# Patient Record
Sex: Female | Born: 1945 | Race: Black or African American | Hispanic: No | Marital: Single | Smoking: Never smoker
Health system: Southern US, Community
[De-identification: ages and names within clinical notes are randomized; demographics above are authoritative.]

## PROBLEM LIST (undated history)

## (undated) DIAGNOSIS — M810 Age-related osteoporosis without current pathological fracture: Secondary | ICD-10-CM

## (undated) DIAGNOSIS — R7303 Prediabetes: Secondary | ICD-10-CM

## (undated) DIAGNOSIS — E559 Vitamin D deficiency, unspecified: Secondary | ICD-10-CM

## (undated) HISTORY — PX: ABDOMINAL HYSTERECTOMY: SHX81

## (undated) HISTORY — PX: OTHER SURGICAL HISTORY: SHX169

## (undated) HISTORY — PX: COLONOSCOPY: SHX174

---

## 2003-11-18 ENCOUNTER — Ambulatory Visit (HOSPITAL_COMMUNITY): Admission: RE | Admit: 2003-11-18 | Discharge: 2003-11-18 | Payer: Self-pay | Admitting: Family Medicine

## 2006-09-10 ENCOUNTER — Ambulatory Visit: Payer: Self-pay | Admitting: Orthopedic Surgery

## 2011-05-16 ENCOUNTER — Other Ambulatory Visit: Payer: Self-pay | Admitting: Family Medicine

## 2011-05-16 ENCOUNTER — Ambulatory Visit (HOSPITAL_COMMUNITY)
Admission: RE | Admit: 2011-05-16 | Discharge: 2011-05-16 | Disposition: A | Discharge status: Home / Self Care | Payer: Managed Care, Other (non HMO) | Source: Ambulatory Visit | Attending: Family Medicine | Admitting: Family Medicine

## 2011-05-16 DIAGNOSIS — R079 Chest pain, unspecified: Secondary | ICD-10-CM | POA: Insufficient documentation

## 2011-06-06 ENCOUNTER — Ambulatory Visit (HOSPITAL_COMMUNITY)
Admission: RE | Admit: 2011-06-06 | Discharge: 2011-06-06 | Disposition: A | Discharge status: Home / Self Care | Payer: Managed Care, Other (non HMO) | Source: Ambulatory Visit | Attending: Family Medicine | Admitting: Family Medicine

## 2011-06-06 ENCOUNTER — Other Ambulatory Visit: Payer: Self-pay | Admitting: Family Medicine

## 2011-06-06 DIAGNOSIS — R9389 Abnormal findings on diagnostic imaging of other specified body structures: Secondary | ICD-10-CM

## 2011-06-06 DIAGNOSIS — J40 Bronchitis, not specified as acute or chronic: Secondary | ICD-10-CM | POA: Insufficient documentation

## 2011-06-06 DIAGNOSIS — R05 Cough: Secondary | ICD-10-CM | POA: Insufficient documentation

## 2011-06-06 DIAGNOSIS — R059 Cough, unspecified: Secondary | ICD-10-CM | POA: Insufficient documentation

## 2011-06-06 DIAGNOSIS — R918 Other nonspecific abnormal finding of lung field: Secondary | ICD-10-CM | POA: Insufficient documentation

## 2021-10-22 ENCOUNTER — Encounter (HOSPITAL_COMMUNITY): Payer: Self-pay

## 2021-10-22 ENCOUNTER — Other Ambulatory Visit: Payer: Self-pay

## 2021-10-22 ENCOUNTER — Other Ambulatory Visit: Payer: Self-pay | Admitting: Preventative Medicine

## 2021-10-22 ENCOUNTER — Emergency Department (HOSPITAL_COMMUNITY)
Admission: EM | Admit: 2021-10-22 | Discharge: 2021-10-22 | Disposition: A | Discharge status: Home / Self Care | Payer: Self-pay | Attending: Emergency Medicine | Admitting: Emergency Medicine

## 2021-10-22 ENCOUNTER — Emergency Department (HOSPITAL_COMMUNITY): Payer: Self-pay

## 2021-10-22 DIAGNOSIS — K8020 Calculus of gallbladder without cholecystitis without obstruction: Secondary | ICD-10-CM | POA: Insufficient documentation

## 2021-10-22 DIAGNOSIS — N823 Fistula of vagina to large intestine: Secondary | ICD-10-CM | POA: Insufficient documentation

## 2021-10-22 DIAGNOSIS — R1031 Right lower quadrant pain: Secondary | ICD-10-CM

## 2021-10-22 DIAGNOSIS — K5730 Diverticulosis of large intestine without perforation or abscess without bleeding: Secondary | ICD-10-CM | POA: Insufficient documentation

## 2021-10-22 DIAGNOSIS — N3001 Acute cystitis with hematuria: Secondary | ICD-10-CM | POA: Insufficient documentation

## 2021-10-22 DIAGNOSIS — D72829 Elevated white blood cell count, unspecified: Secondary | ICD-10-CM | POA: Insufficient documentation

## 2021-10-22 LAB — CBC
HCT: 39 % (ref 36.0–46.0)
Hemoglobin: 13.9 g/dL (ref 12.0–15.0)
MCH: 30.4 pg (ref 26.0–34.0)
MCHC: 35.6 g/dL (ref 30.0–36.0)
MCV: 85.3 fL (ref 80.0–100.0)
Platelets: 206 10*3/uL (ref 150–400)
RBC: 4.57 MIL/uL (ref 3.87–5.11)
RDW: 12.9 % (ref 11.5–15.5)
WBC: 11.7 10*3/uL — ABNORMAL HIGH (ref 4.0–10.5)
nRBC: 0 % (ref 0.0–0.2)

## 2021-10-22 LAB — COMPREHENSIVE METABOLIC PANEL
ALT: 13 U/L (ref 0–44)
AST: 18 U/L (ref 15–41)
Albumin: 3.4 g/dL — ABNORMAL LOW (ref 3.5–5.0)
Alkaline Phosphatase: 83 U/L (ref 38–126)
Anion gap: 7 (ref 5–15)
BUN: 10 mg/dL (ref 8–23)
CO2: 26 mmol/L (ref 22–32)
Calcium: 8.8 mg/dL — ABNORMAL LOW (ref 8.9–10.3)
Chloride: 101 mmol/L (ref 98–111)
Creatinine, Ser: 0.75 mg/dL (ref 0.44–1.00)
GFR, Estimated: >60 mL/min (ref >60)
Glucose, Bld: 118 mg/dL — ABNORMAL HIGH (ref 70–99)
Potassium: 3.6 mmol/L (ref 3.5–5.1)
Sodium: 134 mmol/L — ABNORMAL LOW (ref 135–145)
Total Bilirubin: 1.3 mg/dL — ABNORMAL HIGH (ref 0.3–1.2)
Total Protein: 8.2 g/dL — ABNORMAL HIGH (ref 6.5–8.1)

## 2021-10-22 LAB — URINALYSIS, ROUTINE W REFLEX MICROSCOPIC
Bilirubin Urine: NEGATIVE
Glucose, UA: NEGATIVE mg/dL
Ketones, ur: 5 mg/dL — AB
Nitrite: POSITIVE — AB
Protein, ur: 30 mg/dL — AB
Specific Gravity, Urine: 1.017 (ref 1.005–1.030)
WBC, UA: >50 WBC/hpf — ABNORMAL HIGH (ref 0–5)
pH: 5 (ref 5.0–8.0)

## 2021-10-22 LAB — LIPASE, BLOOD: Lipase: 24 U/L (ref 11–51)

## 2021-10-22 MED ORDER — CEPHALEXIN 500 MG PO CAPS: 500.0000 mg | ORAL_CAPSULE | Freq: Two times a day (BID) | ORAL | 0 refills | Status: DC

## 2021-10-22 MED ORDER — IOHEXOL 300 MG/ML  SOLN
100.0000 mL | Freq: Once | INTRAMUSCULAR | Status: AC | PRN
  Administered 2021-10-22: 100 mL via INTRAVENOUS

## 2021-10-22 MED ORDER — CEPHALEXIN 500 MG PO CAPS
500.0000 mg | ORAL_CAPSULE | Freq: Once | ORAL | Status: AC
  Administered 2021-10-22: 500 mg via ORAL
  Filled 2021-10-22: qty 1

## 2021-10-22 NOTE — ED Notes (Signed)
Pt transported to CT ?

## 2021-10-22 NOTE — ED Notes (Signed)
Patient verbalizes understanding of discharge instructions. Opportunity for questioning and answers were provided. Armband removed by staff, pt discharged from ED to home via POV, ambulatory at DC

## 2021-10-22 NOTE — ED Triage Notes (Signed)
Patient complaining of abdominal pain that started two days ago. No N/V/D. Pain to RLQ. States that she was sent from [facility] for further eval.

## 2021-10-22 NOTE — Discharge Instructions (Addendum)
Ensure that you complete the entire course of antibiotic.  You may take cranberry as needed.  Return to the emergency department if you are experiencing increasing or worsening abdominal pain, urinary symptoms, or decreased fluid intake.

## 2021-10-22 NOTE — ED Provider Notes (Signed)
[facility] EMERGENCY DEPARTMENT Provider Note   CSN: [id number] Arrival date & time: 10/22/21  1343     History Chief Complaint  Patient presents with   Abdominal Pain    Kylie Casey is a 75 y.o. female with no pertinent past medical history presented to the emergency department complaining of intermittent, nonradiating, right lower quadrant pain onset 3 days ago.  Patient reports that over the weekend her pain was 10/10, however now her pain is mild.  She was seen at urgent care today and informed to come to the ED for further evaluation. She has tried a heat pack with minimal relief of her symptoms.  Patient denies any alleviating or exacerbating factors.  She denies nausea, vomiting, diarrhea, fever, chills, constipation, chest pain, or shortness of breath. Denies allergies to medications.  Patient denies alcohol use, new foods, sick contacts.   The history is provided by the patient and a relative. No language interpreter was used.  Abdominal Pain Pain location:  RLQ Pain radiates to:  Does not radiate Onset quality:  Gradual Duration:  3 days Timing:  Intermittent Progression:  Unchanged Chronicity:  New Context: not diet changes, not recent illness, not recent travel and not trauma   Relieved by:  None tried Worsened by:  Nothing Ineffective treatments:  None tried Associated symptoms: no chest pain, no chills, no constipation, no diarrhea, no dysuria, no fever, no hematuria, no nausea, no shortness of breath and no vomiting       History reviewed. No pertinent past medical history.  There are no problems to display for this patient.   Past Surgical History:  Procedure Laterality Date   ABDOMINAL HYSTERECTOMY       OB History   No obstetric history on file.     History reviewed. No pertinent family history.  Social History   Tobacco Use   Smoking status: Never   Smokeless tobacco: Never  Substance Use Topics   Alcohol use: Never   Drug use: Never     Home Medications Prior to Admission medications   Medication Sig Start Date End Date Taking? Authorizing Provider  cephALEXin (KEFLEX) 500 MG capsule Take 1 capsule (500 mg total) by mouth 2 (two) times daily. 10/22/21  Yes Maryellen Dowdle A, PA    Allergies    Patient has no allergy information on record.  Review of Systems   Review of Systems  Constitutional:  Negative for chills and fever.  Respiratory:  Negative for shortness of breath.   Cardiovascular:  Negative for chest pain.  Gastrointestinal:  Positive for abdominal pain. Negative for constipation, diarrhea, nausea and vomiting.  Genitourinary:  Negative for dysuria, frequency and hematuria.  Skin:  Negative for rash.  Allergic/Immunologic: Negative for immunocompromised state.  Neurological:  Negative for weakness and numbness.   Physical Exam Updated Vital Signs BP 124/83 (BP Location: Right Arm)   Pulse 89   Temp 98.6 F (37 C) (Oral)   Resp 18   Ht 5\' 1"  (1.549 m)   Wt 68 kg   SpO2 96%   BMI 28.34 kg/m   Physical Exam Vitals and nursing note reviewed.  Constitutional:      General: She is not in acute distress.    Appearance: She is not diaphoretic.  HENT:     Head: Normocephalic and atraumatic.     Mouth/Throat:     Pharynx: No oropharyngeal exudate.  Eyes:     General: No scleral icterus.    Conjunctiva/sclera: Conjunctivae normal.  Cardiovascular:     Rate and Rhythm: Normal rate and regular rhythm.     Pulses: Normal pulses.     Heart sounds: Normal heart sounds.  Pulmonary:     Effort: Pulmonary effort is normal. No respiratory distress.     Breath sounds: Normal breath sounds. No wheezing.  Abdominal:     General: Bowel sounds are normal.     Palpations: Abdomen is soft. There is no mass.     Tenderness: There is abdominal tenderness in the right lower quadrant and suprapubic area. There is no right CVA tenderness, left CVA tenderness, guarding or rebound. Positive signs include McBurney's  sign. Negative signs include Murphy's sign, Rovsing's sign, psoas sign and obturator sign.     Comments: Mild tenderness to palpation to suprapubic region.  No overlying skin changes.  Musculoskeletal:        General: Normal range of motion.     Cervical back: Normal range of motion and neck supple.  Skin:    General: Skin is warm and dry.     Findings: No rash.  Neurological:     Mental Status: She is alert.  Psychiatric:        Behavior: Behavior normal.    ED Results / Procedures / Treatments   Labs (all labs ordered are listed, but only abnormal results are displayed) Labs Reviewed  COMPREHENSIVE METABOLIC PANEL - Abnormal; Notable for the following components:      Result Value   Sodium 134 (*)    Glucose, Bld 118 (*)    Calcium 8.8 (*)    Total Protein 8.2 (*)    Albumin 3.4 (*)    Total Bilirubin 1.3 (*)    All other components within normal limits  CBC - Abnormal; Notable for the following components:   WBC 11.7 (*)    All other components within normal limits  URINALYSIS, ROUTINE W REFLEX MICROSCOPIC - Abnormal; Notable for the following components:   APPearance HAZY (*)    Hgb urine dipstick MODERATE (*)    Ketones, ur 5 (*)    Protein, ur 30 (*)    Nitrite POSITIVE (*)    Leukocytes,Ua LARGE (*)    WBC, UA >50 (*)    Bacteria, UA MANY (*)    All other components within normal limits  LIPASE, BLOOD    EKG None  Radiology CT ABDOMEN PELVIS W CONTRAST  Result Date: 10/22/2021 CLINICAL DATA:  Right lower quadrant abdominal pain. EXAM: CT ABDOMEN AND PELVIS WITH CONTRAST TECHNIQUE: Multidetector CT imaging of the abdomen and pelvis was performed using the standard protocol following bolus administration of intravenous contrast. CONTRAST:  OMNIPAQUE IOHEXOL 300 MG/ML  SOLN COMPARISON:  None. FINDINGS: Lower chest: The visualized lung bases are clear. No intra-abdominal free air or free fluid. Hepatobiliary: The liver is unremarkable. No intrahepatic biliary  dilatation. Multiple gallstones. No pericholecystic fluid or evidence of acute cholecystitis by CT. Pancreas: Unremarkable. No pancreatic ductal dilatation or surrounding inflammatory changes. Spleen: Normal in size without focal abnormality. Adrenals/Urinary Tract: The adrenal glands unremarkable there is no hydronephrosis or nephrolithiasis on either side. Multiple calcified foci along the course of the right ureter, likely calcification of the collateral vein. There is thickened posterior wall and dome of the bladder, likely related to chronic inflammation. Correlation with urinalysis recommended to exclude cystitis. There is loss of fat plane between the posterior bladder and vaginal cuff consistent with adhesions. Stomach/Bowel: There is moderate stool throughout the colon. There is colonic diverticulosis  without active inflammatory changes. There is loss of fat plane between the sigmoid colon and vaginal cuff with tract likely extension of air into the vagina consistent with a colo vaginal fistula. There is no bowel obstruction. The appendix is not visualized with certainty. No inflammatory changes identified in the right lower quadrant. Vascular/Lymphatic: Mild aortoiliac atherosclerotic disease. The IVC is unremarkable. No portal venous gas. There is no adenopathy. Reproductive: Hysterectomy. Air within the vaginal cuff consistent with colovaginal fistula. No adnexal masses. Other: Midline vertical anterior pelvic wall incisional scar. Musculoskeletal: No acute or significant osseous findings. IMPRESSION: 1. Colovaginal fistula. 2. Colonic diverticulosis. No bowel obstruction. 3. Cholelithiasis. 4. Aortic Atherosclerosis (ICD10-I70.0). Electronically Signed   By: Anner Crete M.D.   On: 10/22/2021 20:09    Procedures Procedures   Medications Ordered in ED Medications  iohexol (OMNIPAQUE) 300 MG/ML solution 100 mL (100 mLs Intravenous Contrast Given 10/22/21 1926)  cephALEXin (KEFLEX) capsule 500  mg (500 mg Oral Given 10/22/21 2058)    ED Course  I have reviewed the triage vital signs and the nursing notes.  Pertinent labs & imaging results that were available during my care of the patient were reviewed by me and considered in my medical decision making (see chart for details).  8:10 PM -patient reevaluated.  Patient without pain at this time.  8:43 PM -patient reevaluated and notified of imaging and lab findings.  Will give patient dose of Keflex tonight.  Prescribed Keflex prescription.  Patient agreeable to plan.    MDM Rules/Calculators/A&P                           Patient presents with right lower quadrant abdominal pain onset 3 days ago.  Patient evaluated at urgent care and reported to the emergency department for further evaluation.  Patient without nausea, vomiting, or diarrhea.  On exam patient with positive McBurney sign and tenderness to palpation to suprapubic region.  Patient vital signs within normal limits, patient afebrile, oxygen saturation 96%. Differential diagnosis includes appendicitis, urinary tract infection, or pancreatitis.   Patient urinalysis notable for nitrates with a large amount of leukocytes and moderate amount hemoglobin.  Patient without urinary symptoms at this time.  CT abdomen pelvis obtained without acute findings for appendicitis.  Labs with slightly increased WBC.  Lipase within normal limit, less likely pancreatitis.  Discussed with patient and daughter at bedside regarding findings.  Will treat UTI with cephalexin prescription.    Strict return precautions given, patient acknowledges and voices understanding.  Patient appears safe for discharge.  Follow-up as indicated in discharge paperwork.   Final Clinical Impression(s) / ED Diagnoses Final diagnoses:  Right lower quadrant abdominal pain  Acute cystitis with hematuria    Rx / DC Orders ED Discharge Orders          Ordered    cephALEXin (KEFLEX) 500 MG capsule  2 times daily         10/22/21 2050             Tyianna Menefee, [place] A, [place] 10/22/21 2107    Noemi Chapel, MD 10/25/21 1241

## 2023-05-19 DIAGNOSIS — H524 Presbyopia: Secondary | ICD-10-CM | POA: Diagnosis not present

## 2023-06-02 ENCOUNTER — Ambulatory Visit (INDEPENDENT_AMBULATORY_CARE_PROVIDER_SITE_OTHER): Payer: No Typology Code available for payment source | Admitting: Family Medicine

## 2023-06-02 ENCOUNTER — Encounter: Payer: Self-pay | Admitting: Family Medicine

## 2023-06-02 VITALS — BP 125/77 | HR 63 | Ht 61.5 in | Wt 139.0 lb

## 2023-06-02 DIAGNOSIS — Z13820 Encounter for screening for osteoporosis: Secondary | ICD-10-CM

## 2023-06-02 DIAGNOSIS — Z23 Encounter for immunization: Secondary | ICD-10-CM | POA: Diagnosis not present

## 2023-06-02 DIAGNOSIS — E7849 Other hyperlipidemia: Secondary | ICD-10-CM | POA: Diagnosis not present

## 2023-06-02 DIAGNOSIS — E559 Vitamin D deficiency, unspecified: Secondary | ICD-10-CM

## 2023-06-02 DIAGNOSIS — R7301 Impaired fasting glucose: Secondary | ICD-10-CM | POA: Diagnosis not present

## 2023-06-02 DIAGNOSIS — E038 Other specified hypothyroidism: Secondary | ICD-10-CM

## 2023-06-02 DIAGNOSIS — Z1159 Encounter for screening for other viral diseases: Secondary | ICD-10-CM

## 2023-06-02 DIAGNOSIS — Z114 Encounter for screening for human immunodeficiency virus [HIV]: Secondary | ICD-10-CM | POA: Diagnosis not present

## 2023-06-02 DIAGNOSIS — K639 Disease of intestine, unspecified: Secondary | ICD-10-CM | POA: Diagnosis not present

## 2023-06-02 NOTE — Assessment & Plan Note (Addendum)
No history of constipation or diarrhea No systemic symptoms reported No abdominal pain reported No urgency, frequency, or pain with urination She reports having bowel movements out of her urethra and her rectum simultaneously No repots of bowel or bladder incontinence She reports that her previous colonoscopy were unremarkable Records requested No changes in her bowel movements She does not take a laxative No nausea or vomiting reported Onset of this has been 4-year Denies a bulging sensation in her vagina No bleeding or rectal pain with bowel movements Uncertain of the etiology of her symptoms We will place a referral into GI for further evaluation The patient has had 2 vaginal deliveries Encouraged Kegel exercises for pelvic floor muscle strengthening

## 2023-06-02 NOTE — Patient Instructions (Addendum)
I appreciate the opportunity to provide care to you today!    Follow up:  3 months  Labs: please stop by the lab today to get your blood drawn (CBC, CMP, TSH, Lipid profile, HgA1c, Vit D)  Screening: HIV and Hep C  I recommend performing kegel exercises  Pelvic floor muscle exercises: These are exercises that can make your pelvic floor muscles stronger. They involve learning ways to tighten and relax specific muscles.Pelvic floor muscle exercises can help keep you from leaking urine, gas, or bowel movements. They can also help with a condition called "pelvic organ prolapse." This is when the organs in the lower belly drop down and press against or bulge into the vagina.  -One way to strengthen your pelvic floor muscles is to do exercises. These are also known as "Kegel" exercises.  A common approach is to try to do a set of the exercises 3 times a day. For each set, do the following about 10 times: ?Squeeze your pelvic muscles. ?Hold the muscles tight for about 10 seconds. ?Relax the muscles completely.   Pelvic floor muscle exercises can help: ?Prevent urine leaks in people who have "stress incontinence" - This means that they leak urine when they cough, laugh, sneeze, or strain. ?Control sudden urges to urinate - These happen to people with "urinary urgency" or "urge incontinence." ?Control the release of gas or bowel movements ?Improve symptoms caused by pelvic organ prolapse - These can include pressure or bulging in the vagina  Referrals today-  GI   Please continue to a heart-healthy diet and increase your physical activities. Try to exercise for at least five days a week.      It was a pleasure to see you and I look forward to continuing to work together on your health and well-being. Please do not hesitate to call the office if you need care or have questions about your care.   Have a wonderful day and week. With Gratitude, Gilmore Laroche MSN, FNP-BC

## 2023-06-02 NOTE — Progress Notes (Signed)
New Patient Office Visit  Subjective:  Patient ID: Kylie Casey, female    DOB: 1946/10/23  Age: 77 y.o. MRN: [id number]  CC:  Chief Complaint  Patient presents with   Establish Care    New patient, establishing care. Has bowel concerns, controlling her bowels.     HPI Kylie Casey is a 77 y.o. female presents for establishing care. For the details of today's visit, please refer to the assessment and plan.     History reviewed. No pertinent past medical history.  Past Surgical History:  Procedure Laterality Date   ABDOMINAL HYSTERECTOMY      History reviewed. No pertinent family history.  Social History   Socioeconomic History   Marital status: Single    Spouse name: Not on file   Number of children: Not on file   Years of education: Not on file   Highest education level: Not on file  Occupational History   Not on file  Tobacco Use   Smoking status: Never   Smokeless tobacco: Never  Substance and Sexual Activity   Alcohol use: Never   Drug use: Never   Sexual activity: Not on file  Other Topics Concern   Not on file  Social History Narrative   Not on file   Social Determinants of Health   Financial Resource Strain: Not on file  Food Insecurity: Not on file  Transportation Needs: Not on file  Physical Activity: Not on file  Stress: Not on file  Social Connections: Not on file  Intimate Partner Violence: Not on file    ROS Review of Systems  Constitutional:  Negative for chills and fever.  Eyes:  Negative for visual disturbance.  Respiratory:  Negative for chest tightness and shortness of breath.   Neurological:  Negative for dizziness and headaches.    Objective:   Today's Vitals: BP 125/77   Pulse 63   Ht 5' 1.5" (1.562 m)   Wt 139 lb 0.6 oz (63.1 kg)   SpO2 92%   BMI 25.85 kg/m   Physical Exam HENT:     Head: Normocephalic.     Mouth/Throat:     Mouth: Mucous membranes are moist.  Cardiovascular:     Rate and Rhythm: Normal rate.      Heart sounds: Normal heart sounds.  Pulmonary:     Effort: Pulmonary effort is normal.     Breath sounds: Normal breath sounds.  Neurological:     Mental Status: She is alert.      Assessment & Plan:   Bowel trouble Assessment & Plan: No history of constipation or diarrhea No systemic symptoms reported No abdominal pain reported No urgency, frequency, or pain with urination She reports having bowel movements out of her urethra and her rectum simultaneously No repots of bowel or bladder incontinence She reports that her previous colonoscopy were unremarkable Records requested No changes in her bowel movements She does not take a laxative No nausea or vomiting reported Onset of this has been 4-year Denies a bulging sensation in her vagina No bleeding or rectal pain with bowel movements Uncertain of the etiology of her symptoms We will place a referral into GI for further evaluation The patient has had 2 vaginal deliveries Encouraged Kegel exercises for pelvic floor muscle strengthening  Orders: -     Ambulatory referral to Gastroenterology  Osteoporosis screening -     DG Bone Density  Need for Tdap vaccination  IFG (impaired fasting glucose) -  Hemoglobin A1c  Vitamin D deficiency -     VITAMIN D 25 Hydroxy (Vit-D Deficiency, Fractures)  Need for hepatitis C screening test -     Hepatitis C antibody  Encounter for screening for HIV -     HIV Antibody (routine testing w rflx)  Other specified hypothyroidism -     TSH + free T4  Other hyperlipidemia -     Lipid panel -     CMP14+EGFR -     CBC with Differential/Platelet    Note: This chart has been completed using Engineer, civil (consulting) software, and while attempts have been made to ensure accuracy, certain words and phrases may not be transcribed as intended.   Follow-up: Return in about 3 months (around 09/02/2023).   Gilmore Laroche, FNP

## 2023-06-03 LAB — CBC WITH DIFFERENTIAL/PLATELET
Eos: 3 %
Hemoglobin: 13.6 g/dL (ref 11.1–15.9)
Lymphocytes Absolute: 1.6 10*3/uL (ref 0.7–3.1)
MCH: 30.7 pg (ref 26.6–33.0)
Monocytes Absolute: 0.4 10*3/uL (ref 0.1–0.9)
Monocytes: 9 %
Platelets: 187 10*3/uL (ref 150–450)
RBC: 4.43 x10E6/uL (ref 3.77–5.28)

## 2023-06-03 LAB — CMP14+EGFR
BUN/Creatinine Ratio: 15 (ref 12–28)
BUN: 11 mg/dL (ref 8–27)
Calcium: 9.2 mg/dL (ref 8.7–10.3)
Potassium: 3.9 mmol/L (ref 3.5–5.2)
eGFR: 82 mL/min/{1.73_m2} (ref >59)

## 2023-06-03 LAB — LIPID PANEL: LDL Chol Calc (NIH): 102 mg/dL — ABNORMAL HIGH (ref 0–99)

## 2023-06-03 LAB — HEPATITIS C ANTIBODY: Hep C Virus Ab: NONREACTIVE

## 2023-06-03 LAB — HEMOGLOBIN A1C: Hgb A1c MFr Bld: 5.7 % — ABNORMAL HIGH (ref 4.8–5.6)

## 2023-06-03 NOTE — Progress Notes (Signed)
Please inform the patient that her hemoglobin A1c is 5.7, this indicates she is prediabetic. I recommend avoiding simple carbohydrates, including cakes, sweet desserts, ice cream, soda (diet or regular), sweet tea, candies, chips, cookies, store-bought juices, alcohol in excess of 1-2 drinks a day, lemonade, artificial sweeteners, donuts, coffee creamers, and sugar-free products.  I recommend avoiding greasy, fatty foods with increased physical activity.  All the labs

## 2023-06-05 LAB — VITAMIN D 25 HYDROXY (VIT D DEFICIENCY, FRACTURES): Vit D, 25-Hydroxy: 46.2 ng/mL (ref 30.0–100.0)

## 2023-06-05 LAB — CBC WITH DIFFERENTIAL/PLATELET
Basophils Absolute: 0 10*3/uL (ref 0.0–0.2)
Basos: 1 %
EOS (ABSOLUTE): 0.1 10*3/uL (ref 0.0–0.4)
Hematocrit: 38.7 % (ref 34.0–46.6)
Immature Grans (Abs): 0 10*3/uL (ref 0.0–0.1)
Immature Granulocytes: 0 %
Lymphs: 41 %
MCHC: 35.1 g/dL (ref 31.5–35.7)
MCV: 87 fL (ref 79–97)
Neutrophils Absolute: 1.8 10*3/uL (ref 1.4–7.0)
Neutrophils: 46 %
RDW: 13.8 % (ref 11.7–15.4)
WBC: 3.9 10*3/uL (ref 3.4–10.8)

## 2023-06-05 LAB — TSH+FREE T4
Free T4: 1.2 ng/dL (ref 0.82–1.77)
TSH: 3.61 u[IU]/mL (ref 0.450–4.500)

## 2023-06-05 LAB — CMP14+EGFR
ALT: 7 IU/L (ref 0–32)
AST: 13 IU/L (ref 0–40)
Albumin: 3.9 g/dL (ref 3.8–4.8)
Alkaline Phosphatase: 105 IU/L (ref 44–121)
Bilirubin Total: 0.9 mg/dL (ref 0.0–1.2)
CO2: 25 mmol/L (ref 20–29)
Chloride: 104 mmol/L (ref 96–106)
Creatinine, Ser: 0.75 mg/dL (ref 0.57–1.00)
Globulin, Total: 3.4 g/dL (ref 1.5–4.5)
Glucose: 87 mg/dL (ref 70–99)
Sodium: 140 mmol/L (ref 134–144)
Total Protein: 7.3 g/dL (ref 6.0–8.5)

## 2023-06-05 LAB — LIPID PANEL
Chol/HDL Ratio: 2.5 ratio (ref 0.0–4.4)
Cholesterol, Total: 188 mg/dL (ref 100–199)
HDL: 74 mg/dL (ref >39)
Triglycerides: 62 mg/dL (ref 0–149)
VLDL Cholesterol Cal: 12 mg/dL (ref 5–40)

## 2023-06-05 LAB — HIV ANTIBODY (ROUTINE TESTING W REFLEX): HIV Screen 4th Generation wRfx: NONREACTIVE

## 2023-06-05 LAB — HEMOGLOBIN A1C: Est. average glucose Bld gHb Est-mCnc: 117 mg/dL

## 2023-06-25 NOTE — Progress Notes (Unsigned)
Referring Provider: Gilmore Laroche, FNP Primary Care Physician:  Gilmore Laroche, FNP Primary Gastroenterologist:  Dr. Tasia Catchings  No chief complaint on file.   HPI:   Kylie Casey is a 77 y.o. female presenting today at the request of Gilmore Laroche, FNP for bowel trouble.   Reviewed recent office visit with ACP 06/02/2023.  She is presenting to establish care.  She was reporting having bowel movements out of her urethra and rectum spontaneously.  Reported this has been going on for 4 years.  She was referred to GI for further evaluation.  Reviewed labs that were completed at her office visit.  No significant abnormalities on CBC, CMP, thyroid function, HIV, hepatitis C, vitamin D.  Hemoglobin A1c was elevated at 5.7.  Reviewed prior CT A/P with contrast 10/22/2021 noting moderate stool throughout the colon, colonic diverticulosis without active inflammatory changes, loss of fat plane between the sigmoid colon and vaginal cuff with track likely extension of air into the vagina consistent with colovaginal fistula  Today:   No past medical history on file.  Past Surgical History:  Procedure Laterality Date   ABDOMINAL HYSTERECTOMY      No current outpatient medications on file.   No current facility-administered medications for this visit.    Allergies as of 06/26/2023   (No Known Allergies)    No family history on file.  Social History   Socioeconomic History   Marital status: Single    Spouse name: Not on file   Number of children: Not on file   Years of education: Not on file   Highest education level: Not on file  Occupational History   Not on file  Tobacco Use   Smoking status: Never   Smokeless tobacco: Never  Substance and Sexual Activity   Alcohol use: Never   Drug use: Never   Sexual activity: Not on file  Other Topics Concern   Not on file  Social History Narrative   Not on file   Social Determinants of Health   Financial Resource Strain: Not on  file  Food Insecurity: Not on file  Transportation Needs: Not on file  Physical Activity: Not on file  Stress: Not on file  Social Connections: Not on file  Intimate Partner Violence: Not on file    Review of Systems: Gen: Denies any fever, chills, fatigue, weight loss, lack of appetite.  CV: Denies chest pain, heart palpitations, peripheral edema, syncope.  Resp: Denies shortness of breath at rest or with exertion. Denies wheezing or cough.  GI: Denies dysphagia or odynophagia. Denies jaundice, hematemesis, fecal incontinence. GU : Denies urinary burning, urinary frequency, urinary hesitancy MS: Denies joint pain, muscle weakness, cramps, or limitation of movement.  Derm: Denies rash, itching, dry skin Psych: Denies depression, anxiety, memory loss, and confusion Heme: Denies bruising, bleeding, and enlarged lymph nodes.  Physical Exam: There were no vitals taken for this visit. General:   Alert and oriented. Pleasant and cooperative. Well-nourished and well-developed.  Head:  Normocephalic and atraumatic. Eyes:  Without icterus, sclera clear and conjunctiva pink.  Ears:  Normal auditory acuity. Lungs:  Clear to auscultation bilaterally. No wheezes, rales, or rhonchi. No distress.  Heart:  S1, S2 present without murmurs appreciated.  Abdomen:  +BS, soft, non-tender and non-distended. No HSM noted. No guarding or rebound. No masses appreciated.  Rectal:  Deferred  Msk:  Symmetrical without gross deformities. Normal posture. Extremities:  Without edema. Neurologic:  Alert and  oriented x4;  grossly normal neurologically. Skin:  Intact without significant lesions or rashes. Psych:  Alert and cooperative. Normal mood and affect.    Assessment:     Plan:  ***   Ermalinda Memos, PA-C [facility] 06/26/2023

## 2023-06-25 NOTE — H&P (View-Only) (Signed)
Referring Provider: Gilmore Laroche, FNP Primary Care Physician:  Gilmore Laroche, FNP Primary Gastroenterologist:  Dr. Tasia Catchings  Chief Complaint  Patient presents with   New Patient (Initial Visit)    Pt says she is having bowel movements, they come out the back but also in the front. "Not sure which hole" per pt. Stools are soft. This has been going on since 2023 and is getting worse. Not having any pain. Eats right.    HPI:   Kylie Casey is a 77 y.o. female presenting today at the request of Gilmore Laroche, FNP for bowel trouble.   Reviewed recent office visit with ACP 06/02/2023.  She is presenting to establish care.  She was reporting having bowel movements out of her urethra and rectum spontaneously.  Reported this has been going on for 4 years.  She was referred to GI for further evaluation.  Reviewed labs that were completed at her office visit.  No significant abnormalities on CBC, CMP, thyroid function, HIV, hepatitis C, vitamin D.  Hemoglobin A1c was elevated at 5.7.  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 Will have a BM and then can just see some dripping and can't tell where it is coming from. Also states she can be sitting or out somewhere and feel a funny sensation like somethingis getting ready to come out of her vagina, and then she passes some stool involuntarily. Has also notices gas coming from her vagina.   Colonoscopy in [place] maybe 5 years ago or so. With [facility]. No history of polyps. No Fhx of colon cancer.   Stools are soft. No constipation.  No brbpr or melena.  No abdominal pain.   No urinary symptoms.  No abnormal vaginal bleeding.  No unintentional weight loss.   Was unaware of CT findings in 2022. States she was just treated for a UTI.   Occasional use of Excedrin.   History reviewed. No pertinent past medical history.  Past Surgical History:  Procedure Laterality Date   ABDOMINAL HYSTERECTOMY     COLONOSCOPY      No current outpatient medications on file.   No current facility-administered medications for this visit.    Allergies as of 06/26/2023   (No Known Allergies)    Family History  Problem Relation Age of Onset   Colon cancer Neg Hx     Social History   Socioeconomic History   Marital status: Single    Spouse name: Not on file   Number of children: Not on file   Years of education: Not on file   Highest education level: Not on file  Occupational History   Not on file  Tobacco Use   Smoking status: Never   Smokeless tobacco: Never  Substance and Sexual Activity   Alcohol use: Never   Drug use: Never   Sexual activity: Not on file  Other Topics Concern   Not on file  Social History Narrative   Not on file   Social Determinants of Health   Financial Resource Strain: Not on file  Food Insecurity: Not on file  Transportation Needs: Not on file  Physical Activity: Not on  file  Stress: Not on file  Social Connections: Not on file  Intimate Partner Violence: Not on file    Review of Systems: Gen: Denies any fever, chills, cold or flulike symptoms, presyncope, syncope. CV: Denies chest pain, heart palpitations. Resp: Denies shortness of breath, cough. GI: See HPI GU : Denies urinary burning, urinary frequency, urinary hesitancy MS: Denies joint pain. Derm: Denies rash. Psych: Denies depression, anxiety. Heme: See HPI  Physical Exam: BP 127/89 (BP Location: Left Arm, Patient Position: Sitting, Cuff Size: Normal)   Pulse 87   Temp (!) 97.3 F (36.3 C) (Temporal)   Ht 5' 1.5" (1.562 m)   Wt 141 lb 12.8 oz (64.3 kg)    SpO2 97%   BMI 26.36 kg/m  General:   Alert and oriented. Pleasant and cooperative. Well-nourished and well-developed.  Head:  Normocephalic and atraumatic. Eyes:  Without icterus, sclera clear and conjunctiva pink.  Ears:  Normal auditory acuity. Lungs:  Clear to auscultation bilaterally. No wheezes, rales, or rhonchi. No distress.  Heart:  S1, S2 present without murmurs appreciated.  Abdomen:  +BS, soft, non-tender and non-distended. No HSM noted. No guarding or rebound. No masses appreciated.  Rectal:  Deferred  Msk:  Symmetrical without gross deformities. Normal posture. Extremities:  Without edema. Neurologic:  Alert and  oriented x4;  grossly normal neurologically. Skin:  Intact without significant lesions or rashes. Psych:   Normal mood and affect.    Assessment:  24 y.op. female in good general heath with no significant past medical history presenting today with concerns about stool and gas coming from her vagina. Symptoms have been present at least since 2023. Denies abdominal pain, constipation, diarrhea, brbpr, melena, unintentional weight loss, urinary symptoms, abnormal vaginal bleeding.  Reviewed CT A/P with contrast 10/22/2021 which showed colovaginal fistula.  Suspect this is the etiology of her symptoms.  Will go ahead and repeat CT at this time to confirm persistent fistula and rule out other complicating features.  If CT confirms this, will need to pursue colonoscopy for further evaluation, rule out underlying malignancy.  Patient reports her last colonoscopy was about 5 years ago with [facility].  We will request these records as well.   Plan:  CT A/P with oral contrast only. If CT confirms colovaginal fistula, will pursue colonoscopy. Request colonoscopy records from [facility].   Ermalinda Memos, PA-C [facility] 06/26/2023

## 2023-06-26 ENCOUNTER — Encounter: Payer: Self-pay | Admitting: Gastroenterology

## 2023-06-26 ENCOUNTER — Ambulatory Visit (INDEPENDENT_AMBULATORY_CARE_PROVIDER_SITE_OTHER): Payer: No Typology Code available for payment source | Admitting: Gastroenterology

## 2023-06-26 VITALS — BP 127/89 | HR 87 | Temp 97.3°F | Ht 61.5 in | Wt 141.8 lb

## 2023-06-26 DIAGNOSIS — N824 Other female intestinal-genital tract fistulae: Secondary | ICD-10-CM

## 2023-06-26 NOTE — Patient Instructions (Signed)
We will arrange to have a CT scan of your abdomen and pelvis to further evaluate your symptoms.  I suspect you have a colovaginal fistula meaning a connection between the colon wall and the vaginal wall.  If the CT scan confirms this, we will pursue a colonoscopy.  It was good to meet you today!   Ermalinda Memos, PA-C [facility]

## 2023-06-30 ENCOUNTER — Ambulatory Visit (INDEPENDENT_AMBULATORY_CARE_PROVIDER_SITE_OTHER): Payer: No Typology Code available for payment source | Admitting: Gastroenterology

## 2023-06-30 ENCOUNTER — Ambulatory Visit (HOSPITAL_COMMUNITY)
Admission: RE | Admit: 2023-06-30 | Discharge: 2023-06-30 | Disposition: A | Discharge status: Home / Self Care | Payer: No Typology Code available for payment source | Source: Ambulatory Visit | Attending: Gastroenterology | Admitting: Gastroenterology

## 2023-06-30 DIAGNOSIS — K632 Fistula of intestine: Secondary | ICD-10-CM | POA: Diagnosis not present

## 2023-06-30 DIAGNOSIS — K8020 Calculus of gallbladder without cholecystitis without obstruction: Secondary | ICD-10-CM | POA: Diagnosis not present

## 2023-06-30 DIAGNOSIS — N824 Other female intestinal-genital tract fistulae: Secondary | ICD-10-CM | POA: Insufficient documentation

## 2023-06-30 DIAGNOSIS — N83202 Unspecified ovarian cyst, left side: Secondary | ICD-10-CM | POA: Diagnosis not present

## 2023-06-30 DIAGNOSIS — K5730 Diverticulosis of large intestine without perforation or abscess without bleeding: Secondary | ICD-10-CM | POA: Diagnosis not present

## 2023-06-30 MED ORDER — IOHEXOL 9 MG/ML PO SOLN
ORAL | Status: AC
  Filled 2023-06-30: qty 500

## 2023-07-02 ENCOUNTER — Telehealth: Payer: Self-pay | Admitting: Gastroenterology

## 2023-07-02 NOTE — Telephone Encounter (Signed)
Received last colonoscopy report from [facility].  Colonoscopy was completed 10/14/2016.  She had a 10 mm tubular adenoma removed, diverticulosis in the sigmoid and descending colon.  Recommended 5-year surveillance.  She is currently overdue for colonoscopy.  We are waiting for CT that was completed on 7/15 be read by radiologist regarding possible colovaginal fistula.  We will likely be arranging colonoscopy in the very near future.

## 2023-07-04 ENCOUNTER — Telehealth: Payer: Self-pay | Admitting: Family Medicine

## 2023-07-04 NOTE — Telephone Encounter (Signed)
Results are not back yet.  

## 2023-07-04 NOTE — Telephone Encounter (Signed)
Patient came by the office to see if results came in for her ct scan asked for nurse to call her at [phone]

## 2023-07-07 ENCOUNTER — Encounter: Payer: Self-pay | Admitting: *Deleted

## 2023-07-22 ENCOUNTER — Encounter (HOSPITAL_COMMUNITY): Payer: Self-pay

## 2023-07-22 ENCOUNTER — Other Ambulatory Visit: Payer: Self-pay

## 2023-07-22 ENCOUNTER — Telehealth: Payer: Self-pay | Admitting: Internal Medicine

## 2023-07-22 ENCOUNTER — Ambulatory Visit (HOSPITAL_BASED_OUTPATIENT_CLINIC_OR_DEPARTMENT_OTHER): Payer: Medicare HMO | Admitting: Anesthesiology

## 2023-07-22 ENCOUNTER — Encounter (HOSPITAL_COMMUNITY)
Admission: RE | Disposition: A | Discharge status: Home / Self Care | Payer: Self-pay | Source: Home / Self Care | Attending: Internal Medicine

## 2023-07-22 ENCOUNTER — Ambulatory Visit (HOSPITAL_COMMUNITY): Payer: Medicare HMO | Admitting: Anesthesiology

## 2023-07-22 ENCOUNTER — Ambulatory Visit (HOSPITAL_COMMUNITY)
Admission: RE | Admit: 2023-07-22 | Discharge: 2023-07-22 | Disposition: A | Discharge status: Home / Self Care | Payer: Medicare HMO | Attending: Internal Medicine | Admitting: Internal Medicine

## 2023-07-22 DIAGNOSIS — N824 Other female intestinal-genital tract fistulae: Secondary | ICD-10-CM

## 2023-07-22 DIAGNOSIS — N823 Fistula of vagina to large intestine: Secondary | ICD-10-CM | POA: Insufficient documentation

## 2023-07-22 DIAGNOSIS — K5730 Diverticulosis of large intestine without perforation or abscess without bleeding: Secondary | ICD-10-CM

## 2023-07-22 DIAGNOSIS — Z86010 Personal history of colonic polyps: Secondary | ICD-10-CM

## 2023-07-22 HISTORY — PX: COLONOSCOPY WITH PROPOFOL: SHX5780

## 2023-07-22 SURGERY — COLONOSCOPY WITH PROPOFOL
Anesthesia: General

## 2023-07-22 MED ORDER — LACTATED RINGERS IV SOLN: INTRAVENOUS | Status: DC

## 2023-07-22 MED ORDER — PROPOFOL 10 MG/ML IV BOLUS
INTRAVENOUS | Status: DC | PRN
Start: 2023-07-22 — End: 2023-07-22
  Administered 2023-07-22: 50 mg via INTRAVENOUS
  Administered 2023-07-22: 100 mg via INTRAVENOUS

## 2023-07-22 MED ORDER — PROPOFOL 500 MG/50ML IV EMUL
INTRAVENOUS | Status: DC | PRN
  Administered 2023-07-22: 125 ug/kg/min via INTRAVENOUS

## 2023-07-22 MED ORDER — LIDOCAINE HCL 1 % IJ SOLN
INTRAMUSCULAR | Status: DC | PRN
  Administered 2023-07-22: 50 mg via INTRADERMAL

## 2023-07-22 NOTE — Telephone Encounter (Signed)
Noted  

## 2023-07-22 NOTE — Addendum Note (Signed)
Addended by: Armstead Peaks on: 07/22/2023 11:34 AM   Modules accepted: Orders

## 2023-07-22 NOTE — Transfer of Care (Signed)
Immediate Anesthesia Transfer of Care Note  Patient: Kylie Casey  Procedure(s) Performed: COLONOSCOPY WITH PROPOFOL  Patient Location: Endoscopy Unit  Anesthesia Type:General  Level of Consciousness: awake  Airway & Oxygen Therapy: Patient Spontanous Breathing  Post-op Assessment: Report given to RN  Post vital signs: Reviewed and stable  Last Vitals:  Vitals Value Taken Time  BP    Temp    Pulse    Resp    SpO2      Last Pain:  Vitals:   07/22/23 1059  TempSrc:   PainSc: 0-No pain      Patients Stated Pain Goal: 6 (07/22/23 1009)  Complications: No notable events documented.

## 2023-07-22 NOTE — Telephone Encounter (Signed)
Just finished colonoscopy on this patient.  Everything looked great besides significant diverticular disease throughout her colon.  Reviewed her CT imaging.    Kylie Casey can we refer her to colorectal surgery in [facility] to discuss colovaginal fistula repair?   Patient is agreeable. Thank you

## 2023-07-22 NOTE — Anesthesia Preprocedure Evaluation (Addendum)
Anesthesia Evaluation  Patient identified by MRN, date of birth, ID band Patient awake    Reviewed: Allergy & Precautions, H&P , NPO status , Patient's Chart, lab work & pertinent test results  Airway Mallampati: II  TM Distance: >3 FB Neck ROM: Full    Dental no notable dental hx. (+) Dental Advisory Given   Pulmonary neg pulmonary ROS   Pulmonary exam normal breath sounds clear to auscultation       Cardiovascular negative cardio ROS Normal cardiovascular exam Rhythm:Regular Rate:Normal     Neuro/Psych negative neurological ROS  negative psych ROS   GI/Hepatic negative GI ROS, Neg liver ROS,,,  Endo/Other  negative endocrine ROS    Renal/GU negative Renal ROS  negative genitourinary   Musculoskeletal negative musculoskeletal ROS (+)    Abdominal   Peds negative pediatric ROS (+)  Hematology negative hematology ROS (+)   Anesthesia Other Findings   Reproductive/Obstetrics negative OB ROS                             Anesthesia Physical Anesthesia Plan  ASA: 2  Anesthesia Plan: General   Post-op Pain Management: Minimal or no pain anticipated   Induction: Intravenous  PONV Risk Score and Plan: 1 and Propofol infusion  Airway Management Planned: Nasal Cannula and Natural Airway  Additional Equipment:   Intra-op Plan:   Post-operative Plan:   Informed Consent: I have reviewed the patients History and Physical, chart, labs and discussed the procedure including the risks, benefits and alternatives for the proposed anesthesia with the patient or authorized representative who has indicated his/her understanding and acceptance.     Dental advisory given  Plan Discussed with: CRNA and Surgeon  Anesthesia Plan Comments:        Anesthesia Quick Evaluation

## 2023-07-22 NOTE — Telephone Encounter (Signed)
Referral placed to [facility] 

## 2023-07-22 NOTE — Anesthesia Postprocedure Evaluation (Signed)
Anesthesia Post Note  Patient: Jessel L Lauderback  Procedure(s) Performed: COLONOSCOPY WITH PROPOFOL  Patient location during evaluation: Short Stay Anesthesia Type: General Level of consciousness: awake and alert Pain management: pain level controlled Vital Signs Assessment: post-procedure vital signs reviewed and stable Respiratory status: spontaneous breathing Cardiovascular status: blood pressure returned to baseline Postop Assessment: no apparent nausea or vomiting Anesthetic complications: no   No notable events documented.   Last Vitals:  Vitals:   07/22/23 1009 07/22/23 1120  BP: 139/87 105/71  Pulse: 72 75  Resp: 17 18  Temp: 36.8 C   SpO2: 95%     Last Pain:  Vitals:   07/22/23 1120  TempSrc: Oral  PainSc: 0-No pain                 Chevelle Coulson

## 2023-07-22 NOTE — Discharge Instructions (Addendum)
  Colonoscopy Discharge Instructions  Read the instructions outlined below and refer to this sheet in the next few weeks. These discharge instructions provide you with general information on caring for yourself after you leave the hospital. Your doctor may also give you specific instructions. While your treatment has been planned according to the most current medical practices available, unavoidable complications occasionally occur.   ACTIVITY You may resume your regular activity, but move at a slower pace for the next 24 hours.  Take frequent rest periods for the next 24 hours.  Walking will help get rid of the air and reduce the bloated feeling in your belly (abdomen).  No driving for 24 hours (because of the medicine (anesthesia) used during the test).   Do not sign any important legal documents or operate any machinery for 24 hours (because of the anesthesia used during the test).  NUTRITION Drink plenty of fluids.  You may resume your normal diet as instructed by your doctor.  Begin with a light meal and progress to your normal diet. Heavy or fried foods are harder to digest and may make you feel sick to your stomach (nauseated).  Avoid alcoholic beverages for 24 hours or as instructed.  MEDICATIONS You may resume your normal medications unless your doctor tells you otherwise.  WHAT YOU CAN EXPECT TODAY Some feelings of bloating in the abdomen.  Passage of more gas than usual.  Spotting of blood in your stool or on the toilet paper.  IF YOU HAD POLYPS REMOVED DURING THE COLONOSCOPY: No aspirin products for 7 days or as instructed.  No alcohol for 7 days or as instructed.  Eat a soft diet for the next 24 hours.  FINDING OUT THE RESULTS OF YOUR TEST Not all test results are available during your visit. If your test results are not back during the visit, make an appointment with your caregiver to find out the results. Do not assume everything is normal if you have not heard from your  caregiver or the medical facility. It is important for you to follow up on all of your test results.  SEEK IMMEDIATE MEDICAL ATTENTION IF: You have more than a spotting of blood in your stool.  Your belly is swollen (abdominal distention).  You are nauseated or vomiting.  You have a temperature over 101.  You have abdominal pain or discomfort that is severe or gets worse throughout the day.   Your colonoscopy was relatively unremarkable.  I did not find any polyps or evidence of colon cancer. You do have significant diverticulosis. I would recommend increasing fiber in your diet or adding OTC Benefiber/Metamucil. Be sure to drink at least 4 to 6 glasses of water daily.   I will refer you to colorectal surgeon to discuss repair of your colovaginal fistula.    I hope you have a great rest of your week!  Hennie Duos. Marletta Lor, D.O. Gastroenterology and Hepatology [facility]

## 2023-07-22 NOTE — Op Note (Signed)
[facility] Patient Name: Kylie Casey Procedure Date: 07/22/2023 10:49 AM MRN: [id number] Date of Birth: Mar 13, 1946 Attending MD: Hennie Duos. Marletta Lor , [place], [id number] CSN: [id number] Age: 77 Admit Type: Outpatient Procedure:                Colonoscopy Indications:              Colovaginal fistula, Abnormal CT imaging Providers:                Hennie Duos. Marletta Lor, DO, Crystal Page, Lennice Sites                            Technician, Technician Referring MD:              Medicines:                See the Anesthesia note for documentation of the                            administered medications Complications:            No immediate complications. Estimated Blood Loss:     Estimated blood loss: none. Procedure:                Pre-Anesthesia Assessment:                           - The anesthesia plan was to use monitored                            anesthesia care (MAC).                           After obtaining informed consent, the colonoscope                            was passed under direct vision. Throughout the                            procedure, the patient's blood pressure, pulse, and                            oxygen saturations were monitored continuously. The                            [id number]-[id number] ([id number]) was introduced through the                            anus and advanced to the the cecum, identified by                            appendiceal orifice and ileocecal valve. The                            colonoscopy was performed without difficulty. The                            patient tolerated the procedure well. The quality  of the bowel preparation was evaluated using the                            BBPS [facility] Bowel Preparation Scale) with scores                            of: Right Colon = 3, Transverse Colon = 3 and Left                            Colon = 3 (entire mucosa seen well with no residual                            staining,  small fragments of stool or opaque                            liquid). The total BBPS score equals 9. Scope In: 11:03:13 AM Scope Out: 11:15:01 AM Scope Withdrawal Time: 0 hours 8 minutes 7 seconds  Total Procedure Duration: 0 hours 11 minutes 48 seconds  Findings:      Many large-mouthed and small-mouthed diverticula were found in the       entire colon, most pronounced in the sigmoid colon. No obvious fistula       seen though difficult to ascertain given significant diverticulosis.      The exam was otherwise without abnormality. Impression:               - Diverticulosis in the entire examined colon.                           - The examination was otherwise normal.                           - No specimens collected. Moderate Sedation:      Per Anesthesia Care Recommendation:           - Patient has a contact number available for                            emergencies. The signs and symptoms of potential                            delayed complications were discussed with the                            patient. Return to normal activities tomorrow.                            Written discharge instructions were provided to the                            patient.                           - Resume previous diet.                           -  Continue present medications.                           - No repeat colonoscopy due to age.                           - Will refer to colorectal surgery to discuss                            possible colovaginal fistula repair. 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 All rights reserved. The codes documented in this report are preliminary and upon coder review may  be revised to meet current compliance requirements. Hennie Duos. Marletta Lor, DO Hennie Duos. Marletta Lor, DO 07/22/2023 11:20:26 AM This report has been signed electronically. Number of Addenda: 0

## 2023-07-24 NOTE — Interval H&P Note (Signed)
History and Physical Interval Note:  07/24/2023 3:19 PM  Kylie Casey  has presented today for surgery, with the diagnosis of colovaginal fistula, hx polyps.  The various methods of treatment have been discussed with the patient and family. After consideration of risks, benefits and other options for treatment, the patient has consented to  Procedure(s) with comments: COLONOSCOPY WITH PROPOFOL (N/A) - 12:15pm, asa 2 as a surgical intervention.  The patient's history has been reviewed, patient examined, no change in status, stable for surgery.  I have reviewed the patient's chart and labs.  Questions were answered to the patient's satisfaction.     Lanelle Bal

## 2023-07-25 ENCOUNTER — Encounter (HOSPITAL_COMMUNITY): Payer: Self-pay | Admitting: Internal Medicine

## 2023-07-29 ENCOUNTER — Ambulatory Visit (HOSPITAL_COMMUNITY)
Admission: RE | Admit: 2023-07-29 | Discharge: 2023-07-29 | Disposition: A | Discharge status: Home / Self Care | Payer: Medicare HMO | Source: Ambulatory Visit | Attending: Family Medicine | Admitting: Family Medicine

## 2023-07-29 DIAGNOSIS — Z780 Asymptomatic menopausal state: Secondary | ICD-10-CM | POA: Diagnosis not present

## 2023-07-29 DIAGNOSIS — M810 Age-related osteoporosis without current pathological fracture: Secondary | ICD-10-CM | POA: Insufficient documentation

## 2023-07-29 DIAGNOSIS — Z13820 Encounter for screening for osteoporosis: Secondary | ICD-10-CM | POA: Insufficient documentation

## 2023-07-31 ENCOUNTER — Other Ambulatory Visit: Payer: Self-pay | Admitting: Family Medicine

## 2023-07-31 DIAGNOSIS — M810 Age-related osteoporosis without current pathological fracture: Secondary | ICD-10-CM

## 2023-07-31 MED ORDER — ALENDRONATE SODIUM 70 MG PO TABS
70.0000 mg | ORAL_TABLET | ORAL | 2 refills | Status: DC
Start: 2023-07-31 — End: 2023-09-02

## 2023-07-31 NOTE — Progress Notes (Signed)
 Please inform the patient that her dexa scan indicated that she has osteoporosis.  We will repeat screening  every 2 years.   I have started treatment for osteoporosis to decrease the risk of fractures and increase bone mass. The prescription for fosamax 70 mg weekly is sent to her pharmacy.   Please advise the patient to take the medication first thing in the morning and wait 30 minutes before the first food, stay upright (not to lie down) after taking fosamax for 30 minutes and until after first food of the day (to reduce esophageal irritation). Do not the mediation with other beverage (except plain water), or other medication(s) of the day.     I recommend increasing her physical activities and taking OTC calcium supplements of 1000 mg daily and vitamin D 800iu.

## 2023-08-01 ENCOUNTER — Telehealth: Payer: Self-pay | Admitting: Family Medicine

## 2023-08-01 NOTE — Telephone Encounter (Signed)
Spoke to pt went over bone density results.

## 2023-08-01 NOTE — Telephone Encounter (Signed)
Patient calling about her test results asking for a call back

## 2023-08-19 ENCOUNTER — Ambulatory Visit: Payer: Self-pay | Admitting: Surgery

## 2023-08-19 DIAGNOSIS — Z86010 Personal history of colonic polyps: Secondary | ICD-10-CM | POA: Diagnosis not present

## 2023-08-19 DIAGNOSIS — Z860101 Personal history of adenomatous and serrated colon polyps: Secondary | ICD-10-CM | POA: Insufficient documentation

## 2023-08-19 DIAGNOSIS — N824 Other female intestinal-genital tract fistulae: Secondary | ICD-10-CM | POA: Diagnosis not present

## 2023-08-26 ENCOUNTER — Other Ambulatory Visit: Payer: Self-pay | Admitting: Urology

## 2023-09-02 ENCOUNTER — Encounter: Payer: Self-pay | Admitting: Family Medicine

## 2023-09-02 ENCOUNTER — Ambulatory Visit (INDEPENDENT_AMBULATORY_CARE_PROVIDER_SITE_OTHER): Payer: Medicare HMO | Admitting: Family Medicine

## 2023-09-02 VITALS — BP 132/77 | HR 70 | Resp 16 | Ht 61.5 in | Wt 140.8 lb

## 2023-09-02 DIAGNOSIS — M810 Age-related osteoporosis without current pathological fracture: Secondary | ICD-10-CM

## 2023-09-02 DIAGNOSIS — R7301 Impaired fasting glucose: Secondary | ICD-10-CM | POA: Diagnosis not present

## 2023-09-02 DIAGNOSIS — E038 Other specified hypothyroidism: Secondary | ICD-10-CM

## 2023-09-02 DIAGNOSIS — E7849 Other hyperlipidemia: Secondary | ICD-10-CM

## 2023-09-02 DIAGNOSIS — R7303 Prediabetes: Secondary | ICD-10-CM

## 2023-09-02 DIAGNOSIS — E559 Vitamin D deficiency, unspecified: Secondary | ICD-10-CM | POA: Diagnosis not present

## 2023-09-02 MED ORDER — RISEDRONATE SODIUM 35 MG PO TABS
35.0000 mg | ORAL_TABLET | ORAL | 0 refills | Status: AC
Start: 2023-09-02 — End: ?

## 2023-09-02 NOTE — Progress Notes (Unsigned)
Established Patient Office Visit  Subjective:  Patient ID: Kylie Casey, female    DOB: [birth year]  Age: 77 y.o. MRN: [id number]  CC:  Chief Complaint  Patient presents with   follow up    3 month follow up visit. No concerns to discuss    HPI Kylie Casey is a 77 y.o. female with past medical history of prediabetes and osteoporosis presents for f/u of  chronic medical conditions.  No past medical history on file.  Past Surgical History:  Procedure Laterality Date   ABDOMINAL HYSTERECTOMY     COLONOSCOPY     COLONOSCOPY WITH PROPOFOL N/A 07/22/2023   Procedure: COLONOSCOPY WITH PROPOFOL;  Surgeon: Lanelle Bal, DO;  Location: [facility];  Service: Endoscopy;  Laterality: N/A;  12:15pm, asa 2   cyst removed      Family History  Problem Relation Age of Onset   Colon cancer Neg Hx     Social History   Socioeconomic History   Marital status: Single    Spouse name: Not on file   Number of children: Not on file   Years of education: Not on file   Highest education level: Not on file  Occupational History   Not on file  Tobacco Use   Smoking status: Never   Smokeless tobacco: Never  Vaping Use   Vaping status: Never Used  Substance and Sexual Activity   Alcohol use: Never   Drug use: Never   Sexual activity: Not on file  Other Topics Concern   Not on file  Social History Narrative   Not on file   Social Determinants of Health   Financial Resource Strain: Not on file  Food Insecurity: Not on file  Transportation Needs: Not on file  Physical Activity: Not on file  Stress: Not on file  Social Connections: Not on file  Intimate Partner Violence: Not on file    Outpatient Medications Prior to Visit  Medication Sig Dispense Refill   alendronate (FOSAMAX) 70 MG tablet Take 1 tablet (70 mg total) by mouth every 7 (seven) days. Take with a full glass of water on an empty stomach. (Patient not taking: Reported on 09/02/2023) 12 tablet 2   No  facility-administered medications prior to visit.    No Known Allergies  ROS Review of Systems  Constitutional:  Negative for chills and fever.  Eyes:  Negative for visual disturbance.  Respiratory:  Negative for chest tightness and shortness of breath.   Neurological:  Negative for dizziness and headaches.      Objective:    Physical Exam HENT:     Head: Normocephalic.     Mouth/Throat:     Mouth: Mucous membranes are moist.  Cardiovascular:     Rate and Rhythm: Normal rate.     Heart sounds: Normal heart sounds.  Pulmonary:     Effort: Pulmonary effort is normal.     Breath sounds: Normal breath sounds.  Neurological:     Mental Status: She is alert.     BP 132/77   Pulse 70   Resp 16   Ht 5' 1.5" (1.562 m)   Wt 140 lb 12.8 oz (63.9 kg)   SpO2 93%   BMI 26.17 kg/m  Wt Readings from Last 3 Encounters:  09/02/23 140 lb 12.8 oz (63.9 kg)  07/22/23 139 lb (63 kg)  06/26/23 141 lb 12.8 oz (64.3 kg)    Lab Results  Component Value Date   TSH 3.610 06/02/2023  Lab Results  Component Value Date   WBC 3.9 06/02/2023   HGB 13.6 06/02/2023   HCT 38.7 06/02/2023   MCV 87 06/02/2023   PLT 187 06/02/2023   Lab Results  Component Value Date   NA 140 06/02/2023   K 3.9 06/02/2023   CO2 25 06/02/2023   GLUCOSE 87 06/02/2023   BUN 11 06/02/2023   CREATININE 0.75 06/02/2023   BILITOT 0.9 06/02/2023   ALKPHOS 105 06/02/2023   AST 13 06/02/2023   ALT 7 06/02/2023   PROT 7.3 06/02/2023   ALBUMIN 3.9 06/02/2023   CALCIUM 9.2 06/02/2023   ANIONGAP 7 10/22/2021   EGFR 82 06/02/2023   Lab Results  Component Value Date   CHOL 188 06/02/2023   Lab Results  Component Value Date   HDL 74 06/02/2023   Lab Results  Component Value Date   LDLCALC 102 (H) 06/02/2023   Lab Results  Component Value Date   TRIG 62 06/02/2023   Lab Results  Component Value Date   CHOLHDL 2.5 06/02/2023   Lab Results  Component Value Date   HGBA1C 5.7 (H) 06/02/2023       Assessment & Plan:  Age-related osteoporosis without current pathological fracture Assessment & Plan: Osteoporosis I recommend taking risedronate 30 mg once every 7 days. Ensure adequate calcium and vitamin D intake: Calcium: 1200 mg daily. Vitamin D: 800 IU daily. I also recommend incorporating weight-bearing and muscle-strengthening exercises to help maintain bone strength.  Orders: -     Risedronate Sodium; Take 1 tablet (35 mg total) by mouth every 7 (seven) days. with water on empty stomach, nothing by mouth or lie down for next 30 minutes.  Dispense: 30 tablet; Refill: 0  Prediabetes Assessment & Plan: For managing prediabetes, I recommend the following lifestyle changes:  Reduce Intake of High-Sugar Foods and Beverages: Limit foods and drinks high in sugar to help regulate blood sugar levels. Increase Consumption of Nutrient-Rich Foods: Focus on incorporating more fruits, vegetables, and whole grains into your diet. Choose Lean Proteins: Opt for lean proteins such as chicken, fish, beans, and legumes. Select Low-Fat Dairy Products: Choose low-fat or non-fat dairy options. Minimize Saturated Fats, Trans Fats, and Cholesterol: Reduce intake of foods high in saturated fats, trans fatty acids, and cholesterol. Engage in Regular Physical Activity: Aim for at least 30 minutes of brisk walking or other moderate activity at least 5 days a week.    IFG (impaired fasting glucose) -     Hemoglobin A1c  Vitamin D deficiency -     VITAMIN D 25 Hydroxy (Vit-D Deficiency, Fractures)  Other specified hypothyroidism -     TSH + free T4  Other hyperlipidemia -     Lipid panel -     CMP14+EGFR -     CBC with Differential/Platelet  Note: This chart has been completed using Engineer, civil (consulting) software, and while attempts have been made to ensure accuracy, certain words and phrases may not be transcribed as intended.    Follow-up: Return in about 6 months (around 03/01/2024).    Gilmore Laroche, FNP

## 2023-09-02 NOTE — Patient Instructions (Addendum)
I appreciate the opportunity to provide care to you today!    Follow up:  6 months  Labs: please stop by the lab today to get your blood drawn (CBC, CMP, TSH, Lipid profile, HgA1c, Vit D)   Osteoporosis I recommend taking risedronate 30 mg once every 7 days. Ensure adequate calcium and vitamin D intake: Calcium: 1200 mg daily. Vitamin D: 800 IU daily. I also recommend incorporating weight-bearing and muscle-strengthening exercises to help maintain bone strength.  Schedule medicare annual wellness  Attached with your AVS, you will find valuable resources for self-education. I highly recommend dedicating some time to thoroughly examine them.   Please continue to a heart-healthy diet and increase your physical activities. Try to exercise for at least five days a week.    It was a pleasure to see you and I look forward to continuing to work together on your health and well-being. Please do not hesitate to call the office if you need care or have questions about your care.  In case of emergency, please visit the Emergency Department for urgent care, or contact our clinic at [phone] to schedule an appointment. We're here to help you!   Have a wonderful day and week. With Gratitude, Gilmore Laroche MSN, FNP-BC

## 2023-09-04 DIAGNOSIS — R7303 Prediabetes: Secondary | ICD-10-CM | POA: Insufficient documentation

## 2023-09-04 DIAGNOSIS — M810 Age-related osteoporosis without current pathological fracture: Secondary | ICD-10-CM | POA: Insufficient documentation

## 2023-09-04 NOTE — Assessment & Plan Note (Signed)
Osteoporosis I recommend taking risedronate 30 mg once every 7 days. Ensure adequate calcium and vitamin D intake: Calcium: 1200 mg daily. Vitamin D: 800 IU daily. I also recommend incorporating weight-bearing and muscle-strengthening exercises to help maintain bone strength.

## 2023-09-04 NOTE — Assessment & Plan Note (Signed)
For managing prediabetes, I recommend the following lifestyle changes:  Reduce Intake of High-Sugar Foods and Beverages: Limit foods and drinks high in sugar to help regulate blood sugar levels. Increase Consumption of Nutrient-Rich Foods: Focus on incorporating more fruits, vegetables, and whole grains into your diet. Choose Lean Proteins: Opt for lean proteins such as chicken, fish, beans, and legumes. Select Low-Fat Dairy Products: Choose low-fat or non-fat dairy options. Minimize Saturated Fats, Trans Fats, and Cholesterol: Reduce intake of foods high in saturated fats, trans fatty acids, and cholesterol. Engage in Regular Physical Activity: Aim for at least 30 minutes of brisk walking or other moderate activity at least 5 days a week.

## 2023-10-09 ENCOUNTER — Encounter: Payer: Self-pay | Admitting: Emergency Medicine

## 2023-10-09 NOTE — Patient Instructions (Addendum)
SURGICAL WAITING ROOM VISITATION  Patients having surgery or a procedure may have no more than 2 support people in the waiting area - these visitors may rotate.    Children under the age of 48 must have an adult with them who is not the patient.  Due to an increase in RSV and influenza rates and associated hospitalizations, children ages 61 and under may not visit patients in [facility] hospitals.  If the patient needs to stay at the hospital during part of their recovery, the visitor guidelines for inpatient rooms apply. Pre-op nurse will coordinate an appropriate time for 1 support person to accompany patient in pre-op.  This support person may not rotate.    Please refer to the [facility] website for the visitor guidelines for Inpatients (after your surgery is over and you are in a regular room).    Your procedure is scheduled on: 10/23/23   Report to [facility] Main Entrance    Report to admitting at 5:15 AM   Call this number if you have problems the morning of surgery [phone]   Follow a clear liquid diet the day before surgery.   You may have the following liquids until 4:30 AM DAY OF SURGERY  Water Non-Citrus Juices (without pulp, NO RED-Apple, White grape, White cranberry) Black Coffee (NO MILK/CREAM OR CREAMERS, sugar ok)  Clear Tea (NO MILK/CREAM OR CREAMERS, sugar ok) regular and decaf                             Plain Jell-O (NO RED)                                           Fruit ices (not with fruit pulp, NO RED)                                     Popsicles (NO RED)                                                               Sports drinks like Gatorade (NO RED)              Drink 2 G2 drinks AT 10:00 PM the night before surgery.        The day of surgery:  Drink ONE (1) Pre-Surgery G2 at 4:30 AM the morning of surgery. Drink in one sitting. Do not sip.  This drink was given to you during your hospital  pre-op appointment visit. Nothing else to  drink after completing the  Pre-Surgery G2.          If you have questions, please contact your surgeon's office.   FOLLOW BOWEL PREP AND ANY ADDITIONAL PRE OP INSTRUCTIONS YOU RECEIVED FROM YOUR SURGEON'S OFFICE!!!     Oral Hygiene is also important to reduce your risk of infection.                                    Remember -  BRUSH YOUR TEETH THE MORNING OF SURGERY WITH YOUR REGULAR TOOTHPASTE  DENTURES WILL BE REMOVED PRIOR TO SURGERY PLEASE DO NOT APPLY "Poly grip" OR ADHESIVES!!!   Stop all vitamins and herbal supplements 7 days before surgery.   Take these medicines the morning of surgery with A SIP OF WATER: None                               You may not have any metal on your body including hair pins, jewelry, and body piercing             Do not wear make-up, lotions, powders, perfumes, or deodorant  Do not wear nail polish including gel and S&S, artificial/acrylic nails, or any other type of covering on natural nails including finger and toenails. If you have artificial nails, gel coating, etc. that needs to be removed by a nail salon please have this removed prior to surgery or surgery may need to be canceled/ delayed if the surgeon/ anesthesia feels like they are unable to be safely monitored.   Do not shave  48 hours prior to surgery.    Do not bring valuables to the hospital. [facility] IS NOT             RESPONSIBLE   FOR VALUABLES.   Contacts, glasses, dentures or bridgework may not be worn into surgery.   Bring small overnight bag day of surgery.   DO NOT BRING YOUR HOME MEDICATIONS TO THE HOSPITAL. PHARMACY WILL DISPENSE MEDICATIONS LISTED ON YOUR MEDICATION LIST TO YOU DURING YOUR ADMISSION IN THE HOSPITAL!    Special Instructions: Bring a copy of your healthcare power of attorney and living will documents the day of surgery if you haven't scanned them before.              Please read over the following fact sheets you were given: IF YOU HAVE QUESTIONS ABOUT  YOUR PRE-OP INSTRUCTIONS PLEASE CALL [phone] Contras    If you received a COVID test during your pre-op visit  it is requested that you wear a mask when out in public, stay away from anyone that may not be feeling well and notify your surgeon if you develop symptoms. If you test positive for Covid or have been in contact with anyone that has tested positive in the last 10 days please notify you surgeon.    [facility] - Preparing for Surgery Before surgery, you can play an important role.  Because skin is not sterile, your skin needs to be as free of germs as possible.  You can reduce the number of germs on your skin by washing with CHG (chlorahexidine gluconate) soap before surgery.  CHG is an antiseptic cleaner which kills germs and bonds with the skin to continue killing germs even after washing. Please DO NOT use if you have an allergy to CHG or antibacterial soaps.  If your skin becomes reddened/irritated stop using the CHG and inform your nurse when you arrive at Short Stay. Do not shave (including legs and underarms) for at least 48 hours prior to the first CHG shower.  You may shave your face/neck.  Please follow these instructions carefully:  1.  Shower with CHG Soap the night before surgery and the  morning of surgery.  2.  If you choose to wash your hair, wash your hair first as usual with your normal  shampoo.  3.  After you shampoo,  rinse your hair and body thoroughly to remove the shampoo.                             4.  Use CHG as you would any other liquid soap.  You can apply chg directly to the skin and wash.  Gently with a scrungie or clean washcloth.  5.  Apply the CHG Soap to your body ONLY FROM THE NECK DOWN.   Do   not use on face/ open                           Wound or open sores. Avoid contact with eyes, ears mouth and   genitals (private parts).                       Wash face,  Genitals (private parts) with your normal soap.             6.  Wash thoroughly, paying  special attention to the area where your    surgery  will be performed.  7.  Thoroughly rinse your body with warm water from the neck down.  8.  DO NOT shower/wash with your normal soap after using and rinsing off the CHG Soap.                9.  Pat yourself dry with a clean towel.            10.  Wear clean pajamas.            11.  Place clean sheets on your bed the night of your first shower and do not  sleep with pets. Day of Surgery : Do not apply any lotions/deodorants the morning of surgery.  Please wear clean clothes to the hospital/surgery center.  FAILURE TO FOLLOW THESE INSTRUCTIONS MAY RESULT IN THE CANCELLATION OF YOUR SURGERY  PATIENT SIGNATURE_________________________________  NURSE SIGNATURE__________________________________  ________________________________________________________________________  Rogelia Mire  An incentive spirometer is a tool that can help keep your lungs clear and active. This tool measures how well you are filling your lungs with each breath. Taking long deep breaths may help reverse or decrease the chance of developing breathing (pulmonary) problems (especially infection) following: A long period of time when you are unable to move or be active. BEFORE THE PROCEDURE  If the spirometer includes an indicator to show your best effort, your nurse or respiratory therapist will set it to a desired goal. If possible, sit up straight or lean slightly forward. Try not to slouch. Hold the incentive spirometer in an upright position. INSTRUCTIONS FOR USE  Sit on the edge of your bed if possible, or sit up as far as you can in bed or on a chair. Hold the incentive spirometer in an upright position. Breathe out normally. Place the mouthpiece in your mouth and seal your lips tightly around it. Breathe in slowly and as deeply as possible, raising the piston or the ball toward the top of the column. Hold your breath for 3-5 seconds or for as long as  possible. Allow the piston or ball to fall to the bottom of the column. Remove the mouthpiece from your mouth and breathe out normally. Rest for a few seconds and repeat Steps 1 through 7 at least 10 times every 1-2 hours when you are awake. Take your time and take a few normal breaths between  deep breaths. The spirometer may include an indicator to show your best effort. Use the indicator as a goal to work toward during each repetition. After each set of 10 deep breaths, practice coughing to be sure your lungs are clear. If you have an incision (the cut made at the time of surgery), support your incision when coughing by placing a pillow or rolled up towels firmly against it. Once you are able to get out of bed, walk around indoors and cough well. You may stop using the incentive spirometer when instructed by your caregiver.  RISKS AND COMPLICATIONS Take your time so you do not get dizzy or light-headed. If you are in pain, you may need to take or ask for pain medication before doing incentive spirometry. It is harder to take a deep breath if you are having pain. AFTER USE Rest and breathe slowly and easily. It can be helpful to keep track of a log of your progress. Your caregiver can provide you with a simple table to help with this. If you are using the spirometer at home, follow these instructions: SEEK MEDICAL CARE IF:  You are having difficultly using the spirometer. You have trouble using the spirometer as often as instructed. Your pain medication is not giving enough relief while using the spirometer. You develop fever of 100.5 F (38.1 C) or higher. SEEK IMMEDIATE MEDICAL CARE IF:  You cough up bloody sputum that had not been present before. You develop fever of 102 F (38.9 C) or greater. You develop worsening pain at or near the incision site. MAKE SURE YOU:  Understand these instructions. Will watch your condition. Will get help right away if you are not doing well or get  worse. Document Released: 04/14/2007 Document Revised: 02/24/2012 Document Reviewed: 06/15/2007 Exit[employer] Patient Information 2014 [employer], [place].   ________________________________________________________________________ WHAT IS A BLOOD TRANSFUSION? Blood Transfusion Information  A transfusion is the replacement of blood or some of its parts. Blood is made up of multiple cells which provide different functions. Red blood cells carry oxygen and are used for blood loss replacement. White blood cells fight against infection. Platelets control bleeding. Plasma helps clot blood. Other blood products are available for specialized needs, such as hemophilia or other clotting disorders. BEFORE THE TRANSFUSION  Who gives blood for transfusions?  Healthy volunteers who are fully evaluated to make sure their blood is safe. This is blood bank blood. Transfusion therapy is the safest it has ever been in the practice of medicine. Before blood is taken from a donor, a complete history is taken to make sure that person has no history of diseases nor engages in risky social behavior (examples are intravenous drug use or sexual activity with multiple partners). The donor's travel history is screened to minimize risk of transmitting infections, such as malaria. The donated blood is tested for signs of infectious diseases, such as HIV and hepatitis. The blood is then tested to be sure it is compatible with you in order to minimize the chance of a transfusion reaction. If you or a relative donates blood, this is often done in anticipation of surgery and is not appropriate for emergency situations. It takes many days to process the donated blood. RISKS AND COMPLICATIONS Although transfusion therapy is very safe and saves many lives, the main dangers of transfusion include:  Getting an infectious disease. Developing a transfusion reaction. This is an allergic reaction to something in the blood you were given. Every  precaution is taken to prevent this.  The decision to have a blood transfusion has been considered carefully by your caregiver before blood is given. Blood is not given unless the benefits outweigh the risks. AFTER THE TRANSFUSION Right after receiving a blood transfusion, you will usually feel much better and more energetic. This is especially true if your red blood cells have gotten low (anemic). The transfusion raises the level of the red blood cells which carry oxygen, and this usually causes an energy increase. The nurse administering the transfusion will monitor you carefully for complications. HOME CARE INSTRUCTIONS  No special instructions are needed after a transfusion. You may find your energy is better. Speak with your caregiver about any limitations on activity for underlying diseases you may have. SEEK MEDICAL CARE IF:  Your condition is not improving after your transfusion. You develop redness or irritation at the intravenous (IV) site. SEEK IMMEDIATE MEDICAL CARE IF:  Any of the following symptoms occur over the next 12 hours: Shaking chills. You have a temperature by mouth above 102 F (38.9 C), not controlled by medicine. Chest, back, or muscle pain. People around you feel you are not acting correctly or are confused. Shortness of breath or difficulty breathing. Dizziness and fainting. You get a rash or develop hives. You have a decrease in urine output. Your urine turns a dark color or changes to pink, red, or brown. Any of the following symptoms occur over the next 10 days: You have a temperature by mouth above 102 F (38.9 C), not controlled by medicine. Shortness of breath. Weakness after normal activity. The white part of the eye turns yellow (jaundice). You have a decrease in the amount of urine or are urinating less often. Your urine turns a dark color or changes to pink, red, or brown. Document Released: 11/29/2000 Document Revised: 02/24/2012 Document Reviewed:  07/18/2008 [facility] Patient Information 2014 [place], [place].  _______________________________________________________________________

## 2023-10-09 NOTE — Progress Notes (Addendum)
Ostomy consult done at PAT.  COVID Vaccine Completed: no  Date of COVID positive in last 90 days: no  PCP - Gilmore Laroche, FNP Cardiologist - n/a  Chest x-ray - n/a EKG - n/a Stress Test - n/a ECHO -  n/a Cardiac Cath - n/a Pacemaker/ICD device last checked: n/a Spinal Cord Stimulator: n/a  Bowel Prep - patient waiting for instructions. She says they are getting sent to her in the mail. Clears day before, miralax, dulcolax, and antibiotics  Sleep Study - n/a CPAP -   Fasting Blood Sugar - preDM Checks Blood Sugar no checks at home  Last dose of GLP1 agonist-  N/A GLP1 instructions:  N/A   Last dose of SGLT-2 inhibitors-  N/A SGLT-2 instructions: N/A   Blood Thinner Instructions: n/a Aspirin Instructions: Last Dose:  Activity level: Can go up a flight of stairs and perform activities of daily living without stopping and without symptoms of chest pain or shortness of breath.   Anesthesia review:   Patient denies shortness of breath, fever, cough and chest pain at PAT appointment  Patient verbalized understanding of instructions that were given to them at the PAT appointment. Patient was also instructed that they will need to review over the PAT instructions again at home before surgery.

## 2023-10-10 ENCOUNTER — Encounter (HOSPITAL_COMMUNITY): Payer: Self-pay

## 2023-10-10 ENCOUNTER — Encounter (HOSPITAL_COMMUNITY)
Admission: RE | Admit: 2023-10-10 | Discharge: 2023-10-10 | Disposition: A | Discharge status: Home / Self Care | Payer: Medicare HMO | Source: Ambulatory Visit | Attending: Surgery | Admitting: Surgery

## 2023-10-10 ENCOUNTER — Other Ambulatory Visit: Payer: Self-pay

## 2023-10-10 VITALS — BP 143/84 | HR 64 | Temp 98.1°F | Resp 16 | Ht 61.5 in | Wt 139.0 lb

## 2023-10-10 DIAGNOSIS — Z01818 Encounter for other preprocedural examination: Secondary | ICD-10-CM

## 2023-10-10 DIAGNOSIS — Z01812 Encounter for preprocedural laboratory examination: Secondary | ICD-10-CM | POA: Insufficient documentation

## 2023-10-10 DIAGNOSIS — E7849 Other hyperlipidemia: Secondary | ICD-10-CM | POA: Diagnosis not present

## 2023-10-10 HISTORY — DX: Vitamin D deficiency, unspecified: E55.9

## 2023-10-10 HISTORY — DX: Age-related osteoporosis without current pathological fracture: M81.0

## 2023-10-10 HISTORY — DX: Prediabetes: R73.03

## 2023-10-10 LAB — CBC
HCT: 38.5 % (ref 36.0–46.0)
Hemoglobin: 13.4 g/dL (ref 12.0–15.0)
MCH: 31.1 pg (ref 26.0–34.0)
MCHC: 34.8 g/dL (ref 30.0–36.0)
MCV: 89.3 fL (ref 80.0–100.0)
Platelets: 184 10*3/uL (ref 150–400)
RBC: 4.31 MIL/uL (ref 3.87–5.11)
RDW: 13.4 % (ref 11.5–15.5)
WBC: 5.3 10*3/uL (ref 4.0–10.5)
nRBC: 0 % (ref 0.0–0.2)

## 2023-10-10 LAB — TYPE AND SCREEN
ABO/RH(D): A POS
Antibody Screen: NEGATIVE

## 2023-10-10 NOTE — Consult Note (Addendum)
WOC Nurse requested for preoperative stoma site marking  Discussed surgical procedure and stoma creation with patient.  Explained role of the WOC nurse team. Provided the patient with educational booklet and provided samples of pouching options. Answered patient's questions.   Examined patient sitting, and standing in order to place the marking in the patient's visual field, away from any creases or abdominal contour issues and within the rectus muscle.  Attempted to mark below the patient's belt line, but this was not possible, since a significant crease occurs above and also lower on the abd when the patient leans forward which should be avoided if possible.    Marked for colostomy in the LLQ  __7__ cm to the left of the umbilicus and __5__cm above the umbilicus.  Marked for ileostomy in the RLQ  __7__cm to the right of the umbilicus and  __5__ cm above the umbilicus.  Patient's abdomen cleansed with CHG wipes at site markings, allowed to air dry prior to marking. Covered marks with thin film transparent dressing to preserve mark until date of surgery. Provided patient with a marking pen and instructed to re-color in the sites if they begin to fade prior to surgery.   WOC Nurse team will follow up with patient after surgery for continued ostomy care and teaching if she receives an ostomy.  Thank-you,  Cammie Mcgee MSN, RN, CWOCN, [place], CNS [phone]

## 2023-10-15 ENCOUNTER — Ambulatory Visit: Payer: Medicare HMO | Admitting: Emergency Medicine

## 2023-10-15 VITALS — Ht 61.0 in | Wt 140.0 lb

## 2023-10-15 DIAGNOSIS — Z0000 Encounter for general adult medical examination without abnormal findings: Secondary | ICD-10-CM

## 2023-10-15 NOTE — Patient Instructions (Addendum)
Kylie Casey , Thank you for taking time to come for your Medicare Wellness Visit. I appreciate your ongoing commitment to your health goals. Please review the following plan we discussed and let me know if I can assist you in the future.   Referrals/Orders/Follow-Ups/Clinician Recommendations: Get a tetanus shot at your earliest convenience.  This is a list of the screening recommended for you and due dates:  Health Maintenance  Topic Date Due   Zoster (Shingles) Vaccine (1 of 2) Never done   COVID-19 Vaccine (1 - 2023-24 season) Never done   Flu Shot  03/15/2024*   Pneumonia Vaccine (1 of 1 - PCV) 06/01/2024*   Medicare Annual Wellness Visit  10/14/2024   DEXA scan (bone density measurement)  07/28/2025   Hepatitis C Screening  Completed   HPV Vaccine  Aged Out   DTaP/Tdap/Td vaccine  Discontinued   Colon Cancer Screening  Discontinued  *Topic was postponed. The date shown is not the original due date.    Advanced directives: (Declined) Advance directive discussed with you today. Even though you declined this today, please call our office should you change your mind, and we can give you the proper paperwork for you to fill out.  Next Medicare Annual Wellness Visit scheduled for next year: Yes, 10/18/24 @ 10:40am

## 2023-10-15 NOTE — Progress Notes (Signed)
Subjective:   Kylie Casey is a 77 y.o. female who presents for an Initial Medicare Annual Wellness Visit.  Visit Complete: Virtual I connected with  [name] on 10/15/23 by a audio enabled telemedicine application and verified that I am speaking with the correct person using two identifiers.  Patient Location: Home  Provider Location: Home Office  I discussed the limitations of evaluation and management by telemedicine. The patient expressed understanding and agreed to proceed.  Vital Signs: Because this visit was a virtual/telehealth visit, some criteria may be missing or patient reported. Any vitals not documented were not able to be obtained and vitals that have been documented are patient reported.   Cardiac Risk Factors include: advanced age (>33men, >60 women);Other (see comment), Risk factor comments: pre-diabetes     Objective:    Today's Vitals   10/15/23 1104  Weight: 140 lb (63.5 kg)  Height: 5\' 1"  (1.549 m)   Body mass index is 26.45 kg/m.     10/15/2023   11:19 AM 10/10/2023    9:19 AM 07/22/2023    9:56 AM 10/22/2021    2:14 PM  Advanced Directives  Does Patient Have a Medical Advance Directive? No Yes No No  Type of IT consultant of Healthcare Power of Attorney in Chart?  No - copy requested    Would patient like information on creating a medical advance directive? No - Patient declined  No - Patient declined No - Patient declined    Current Medications (verified) Outpatient Encounter Medications as of 10/15/2023  Medication Sig   CALCIUM PO Take 1 tablet by mouth in the morning and at bedtime.   cholecalciferol (VITAMIN D3) 25 MCG (1000 UNIT) tablet Take 1,000 Units by mouth daily.   Cod Liver Oil CAPS Take 1 capsule by mouth daily.   Flaxseed, Linseed, (FLAXSEED OIL PO) Take 5 mLs by mouth 2 (two) times a week.   vitamin E 180 MG (400 UNITS) capsule Take 400 Units by mouth daily.   risedronate (ACTONEL)  35 MG tablet Take 1 tablet (35 mg total) by mouth every 7 (seven) days. with water on empty stomach, nothing by mouth or lie down for next 30 minutes. (Patient not taking: Reported on 10/07/2023)   No facility-administered encounter medications on file as of 10/15/2023.    Allergies (verified) Patient has no known allergies.   History: Past Medical History:  Diagnosis Date   Osteoporosis    Pre-diabetes    Vitamin D deficiency    Past Surgical History:  Procedure Laterality Date   ABDOMINAL HYSTERECTOMY     COLONOSCOPY     COLONOSCOPY WITH PROPOFOL N/A 07/22/2023   Procedure: COLONOSCOPY WITH PROPOFOL;  Surgeon: Lanelle Bal, DO;  Location: [facility];  Service: Endoscopy;  Laterality: N/A;  12:15pm, asa 2   cyst removed     Family History  Problem Relation Age of Onset   Stroke Mother    Stroke Father    Hypertension Father    Colon cancer Neg Hx    Social History   Socioeconomic History   Marital status: Single    Spouse name: Not on file   Number of children: 2   Years of education: Not on file   Highest education level: Not on file  Occupational History   Occupation: retired  Tobacco Use   Smoking status: Never   Smokeless tobacco: Never  Vaping Use   Vaping status:  Never Used  Substance and Sexual Activity   Alcohol use: Never   Drug use: Never   Sexual activity: Not on file  Other Topics Concern   Not on file  Social History Narrative   Never married, 2 daughters   Social Determinants of Health   Financial Resource Strain: Low Risk  (10/15/2023)   Overall Financial Resource Strain (CARDIA)    Difficulty of Paying Living Expenses: Not hard at all  Food Insecurity: No Food Insecurity (10/15/2023)   Hunger Vital Sign    Worried About Running Out of Food in the Last Year: Never true    Ran Out of Food in the Last Year: Never true  Transportation Needs: No Transportation Needs (10/15/2023)   PRAPARE - Administrator, Civil Service  (Medical): No    Lack of Transportation (Non-Medical): No  Physical Activity: Insufficiently Active (10/15/2023)   Exercise Vital Sign    Days of Exercise per Week: 2 days    Minutes of Exercise per Session: 30 min  Stress: No Stress Concern Present (10/15/2023)   [employer] of Occupational Health - Occupational Stress Questionnaire    Feeling of Stress : Only a little  Social Connections: Socially Isolated (10/15/2023)   Social Connection and Isolation Panel [NHANES]    Frequency of Communication with Friends and Family: More than three times a week    Frequency of Social Gatherings with Friends and Family: More than three times a week    Attends Religious Services: Never    Database administrator or Organizations: No    Attends Engineer, structural: Never    Marital Status: Never married    Tobacco Counseling Counseling given: Not Answered   Clinical Intake:  Pre-visit preparation completed: Yes  Pain : No/denies pain     BMI - recorded: 26.45 Nutritional Status: BMI 25 -29 Overweight Nutritional Risks: None Diabetes: No  How often do you need to have someone help you when you read instructions, pamphlets, or other written materials from your doctor or pharmacy?: 1 - Never  Interpreter Needed?: No  Information entered by :: Tora Kindred, CMA   Activities of Daily Living    10/15/2023   11:06 AM 10/10/2023    9:21 AM  In your present state of health, do you have any difficulty performing the following activities:  Hearing? 0   Vision? 0   Difficulty concentrating or making decisions? 0   Walking or climbing stairs? 0   Dressing or bathing? 0   Doing errands, shopping? 0 0  Preparing Food and eating ? N   Using the Toilet? N   In the past six months, have you accidently leaked urine? N   Do you have problems with loss of bowel control? N   Managing your Medications? N   Managing your Finances? N   Housekeeping or managing your Housekeeping?  N     Patient Care Team: Gilmore Laroche, FNP as PCP - General (Family Medicine) Karie Soda, MD as Consulting Physician (General Surgery) Lanelle Bal, DO as Consulting Physician (Gastroenterology)  Indicate any recent Medical Services you may have received from other than [facility] providers in the past year (date may be approximate).     Assessment:   This is a routine wellness examination for Makenly.  Hearing/Vision screen Hearing Screening - Comments:: Denies hearing loss Vision Screening - Comments:: Gets eye exams   Goals Addressed  This Visit's Progress     Patient Stated (pt-stated)        Exercise more      Depression Screen    10/15/2023   11:17 AM 09/02/2023   10:55 AM 06/02/2023    1:55 PM 06/02/2023    1:41 PM  PHQ 2/9 Scores  PHQ - 2 Score 0 2 0 0  PHQ- 9 Score 0 8 2 0    Fall Risk    10/15/2023   11:20 AM 09/02/2023   10:55 AM 06/02/2023    1:41 PM  Fall Risk   Falls in the past year? 0 0 0  Number falls in past yr: 0 0 0  Injury with Fall? 0 0 0  Risk for fall due to : No Fall Risks  No Fall Risks  Follow up Falls prevention discussed  Falls evaluation completed    MEDICARE RISK AT HOME: Medicare Risk at Home Any stairs in or around the home?: Yes If so, are there any without handrails?: No Home free of loose throw rugs in walkways, pet beds, electrical cords, etc?: Yes Adequate lighting in your home to reduce risk of falls?: Yes Life alert?: No Use of a cane, walker or w/c?: No Grab bars in the bathroom?: Yes Shower chair or bench in shower?: No Elevated toilet seat or a handicapped toilet?: No  TIMED UP AND GO:  Was the test performed? No    Cognitive Function:        10/15/2023   11:22 AM  6CIT Screen  What Year? 0 points  What month? 0 points  What time? 0 points  Count back from 20 0 points  Months in reverse 0 points  Repeat phrase 0 points  Total Score 0 points    Immunizations Immunization  History  Administered Date(s) Administered   Td (Adult),5 Lf Tetanus Toxid, Preservative Free 07/11/2007    TDAP status: Due, Education has been provided regarding the importance of this vaccine. Advised may receive this vaccine at local pharmacy or Health Dept. Aware to provide a copy of the vaccination record if obtained from local pharmacy or Health Dept. Verbalized acceptance and understanding.  Flu Vaccine status: Declined, Education has been provided regarding the importance of this vaccine but patient still declined. Advised may receive this vaccine at local pharmacy or Health Dept. Aware to provide a copy of the vaccination record if obtained from local pharmacy or Health Dept. Verbalized acceptance and understanding.  Pneumococcal vaccine status: Declined,  Education has been provided regarding the importance of this vaccine but patient still declined. Advised may receive this vaccine at local pharmacy or Health Dept. Aware to provide a copy of the vaccination record if obtained from local pharmacy or Health Dept. Verbalized acceptance and understanding.   Covid-19 vaccine status: Declined, Education has been provided regarding the importance of this vaccine but patient still declined. Advised may receive this vaccine at local pharmacy or Health Dept.or vaccine clinic. Aware to provide a copy of the vaccination record if obtained from local pharmacy or Health Dept. Verbalized acceptance and understanding.  Qualifies for Shingles Vaccine? Yes   Zostavax completed No   Shingrix Completed?: No.    Education has been provided regarding the importance of this vaccine. Patient has been advised to call insurance company to determine out of pocket expense if they have not yet received this vaccine. Advised may also receive vaccine at local pharmacy or Health Dept. Verbalized acceptance and understanding. Patient declined  Screening Tests Health  Maintenance  Topic Date Due   Zoster Vaccines-  Shingrix (1 of 2) Never done   COVID-19 Vaccine (1 - 2023-24 season) Never done   INFLUENZA VACCINE  03/15/2024 (Originally 07/17/2023)   Pneumonia Vaccine 44+ Years old (1 of 1 - PCV) 06/01/2024 (Originally 09/17/2010)   Medicare Annual Wellness (AWV)  10/14/2024   DEXA SCAN  Completed   Hepatitis C Screening  Completed   HPV VACCINES  Aged Out   DTaP/Tdap/Td  Discontinued   Colonoscopy  Discontinued    Health Maintenance  Health Maintenance Due  Topic Date Due   Zoster Vaccines- Shingrix (1 of 2) Never done   COVID-19 Vaccine (1 - 2023-24 season) Never done    Colorectal cancer screening: No longer required.   Mammogram status: No longer required due to age.  Bone Density status: Completed 07/29/23. Results reflect: Bone density results: OSTEOPOROSIS. Repeat every 2 years.  Lung Cancer Screening: (Low Dose CT Chest recommended if Age 21-80 years, 20 pack-year currently smoking OR have quit w/in 15years.) does not qualify.   Lung Cancer Screening Referral: n/a  Additional Screening:  Hepatitis C Screening: does not qualify; Completed 06/02/23  Vision Screening: Recommended annual ophthalmology exams for early detection of glaucoma and other disorders of the eye. Dental Screening: Recommended annual dental exams for proper oral hygiene   Community Resource Referral / Chronic Care Management: CRR required this visit?  No   CCM required this visit?  No     Plan:     I have personally reviewed and noted the following in the patient's chart:   Medical and social history Use of alcohol, tobacco or illicit drugs  Current medications and supplements including opioid prescriptions. Patient is not currently taking opioid prescriptions. Functional ability and status Nutritional status Physical activity Advanced directives List of other physicians Hospitalizations, surgeries, and ER visits in previous 12 months Vitals Screenings to include cognitive, depression, and  falls Referrals and appointments  In addition, I have reviewed and discussed with patient certain preventive protocols, quality metrics, and best practice recommendations. A written personalized care plan for preventive services as well as general preventive health recommendations were provided to patient.     Tora Kindred, CMA   10/15/2023   After Visit Summary: (Declined) Due to this being a telephonic visit, with patients personalized plan was offered to patient but patient Declined AVS at this time   Nurse Notes:  Needs tetanus vaccine Declined flu, pneumonia, covid and shingles vaccines.

## 2023-10-23 ENCOUNTER — Inpatient Hospital Stay (HOSPITAL_COMMUNITY)
Admission: RE | Admit: 2023-10-23 | Discharge: 2023-10-28 | DRG: 330 | Disposition: A | Discharge status: Home / Self Care | Payer: Medicare HMO | Attending: Surgery | Admitting: Surgery

## 2023-10-23 ENCOUNTER — Inpatient Hospital Stay (HOSPITAL_COMMUNITY): Payer: Medicare HMO | Admitting: Anesthesiology

## 2023-10-23 ENCOUNTER — Encounter (HOSPITAL_COMMUNITY): Payer: Self-pay | Admitting: Surgery

## 2023-10-23 ENCOUNTER — Other Ambulatory Visit: Payer: Self-pay

## 2023-10-23 ENCOUNTER — Encounter (HOSPITAL_COMMUNITY)
Admission: RE | Disposition: A | Discharge status: Home / Self Care | Payer: Self-pay | Source: Home / Self Care | Attending: Surgery

## 2023-10-23 DIAGNOSIS — N823 Fistula of vagina to large intestine: Principal | ICD-10-CM | POA: Diagnosis present

## 2023-10-23 DIAGNOSIS — I951 Orthostatic hypotension: Secondary | ICD-10-CM | POA: Diagnosis not present

## 2023-10-23 DIAGNOSIS — N824 Other female intestinal-genital tract fistulae: Secondary | ICD-10-CM | POA: Diagnosis not present

## 2023-10-23 DIAGNOSIS — K660 Peritoneal adhesions (postprocedural) (postinfection): Secondary | ICD-10-CM | POA: Diagnosis present

## 2023-10-23 DIAGNOSIS — E876 Hypokalemia: Secondary | ICD-10-CM | POA: Diagnosis not present

## 2023-10-23 DIAGNOSIS — N83292 Other ovarian cyst, left side: Secondary | ICD-10-CM | POA: Diagnosis present

## 2023-10-23 DIAGNOSIS — K567 Ileus, unspecified: Secondary | ICD-10-CM | POA: Diagnosis not present

## 2023-10-23 DIAGNOSIS — N736 Female pelvic peritoneal adhesions (postinfective): Secondary | ICD-10-CM | POA: Diagnosis present

## 2023-10-23 DIAGNOSIS — Z9889 Other specified postprocedural states: Secondary | ICD-10-CM

## 2023-10-23 DIAGNOSIS — K529 Noninfective gastroenteritis and colitis, unspecified: Secondary | ICD-10-CM | POA: Diagnosis present

## 2023-10-23 DIAGNOSIS — Z87440 Personal history of urinary (tract) infections: Secondary | ICD-10-CM | POA: Diagnosis not present

## 2023-10-23 DIAGNOSIS — Z8249 Family history of ischemic heart disease and other diseases of the circulatory system: Secondary | ICD-10-CM | POA: Diagnosis not present

## 2023-10-23 DIAGNOSIS — K5730 Diverticulosis of large intestine without perforation or abscess without bleeding: Secondary | ICD-10-CM | POA: Diagnosis not present

## 2023-10-23 DIAGNOSIS — Z823 Family history of stroke: Secondary | ICD-10-CM

## 2023-10-23 DIAGNOSIS — Z79899 Other long term (current) drug therapy: Secondary | ICD-10-CM

## 2023-10-23 DIAGNOSIS — R55 Syncope and collapse: Secondary | ICD-10-CM | POA: Insufficient documentation

## 2023-10-23 DIAGNOSIS — K5720 Diverticulitis of large intestine with perforation and abscess without bleeding: Secondary | ICD-10-CM | POA: Diagnosis present

## 2023-10-23 DIAGNOSIS — R7303 Prediabetes: Secondary | ICD-10-CM | POA: Diagnosis not present

## 2023-10-23 DIAGNOSIS — I9581 Postprocedural hypotension: Secondary | ICD-10-CM | POA: Diagnosis not present

## 2023-10-23 DIAGNOSIS — K565 Intestinal adhesions [bands], unspecified as to partial versus complete obstruction: Secondary | ICD-10-CM | POA: Diagnosis not present

## 2023-10-23 DIAGNOSIS — Z860101 Personal history of adenomatous and serrated colon polyps: Secondary | ICD-10-CM | POA: Diagnosis not present

## 2023-10-23 DIAGNOSIS — D62 Acute posthemorrhagic anemia: Secondary | ICD-10-CM | POA: Diagnosis not present

## 2023-10-23 DIAGNOSIS — K5732 Diverticulitis of large intestine without perforation or abscess without bleeding: Secondary | ICD-10-CM | POA: Diagnosis not present

## 2023-10-23 DIAGNOSIS — N3090 Cystitis, unspecified without hematuria: Secondary | ICD-10-CM | POA: Diagnosis present

## 2023-10-23 DIAGNOSIS — Z408 Encounter for other prophylactic surgery: Secondary | ICD-10-CM | POA: Diagnosis not present

## 2023-10-23 DIAGNOSIS — M810 Age-related osteoporosis without current pathological fracture: Secondary | ICD-10-CM | POA: Diagnosis present

## 2023-10-23 DIAGNOSIS — Z90710 Acquired absence of both cervix and uterus: Secondary | ICD-10-CM

## 2023-10-23 DIAGNOSIS — E559 Vitamin D deficiency, unspecified: Secondary | ICD-10-CM | POA: Diagnosis present

## 2023-10-23 DIAGNOSIS — N739 Female pelvic inflammatory disease, unspecified: Secondary | ICD-10-CM | POA: Diagnosis not present

## 2023-10-23 LAB — BASIC METABOLIC PANEL
Anion gap: 9 (ref 5–15)
BUN: 8 mg/dL (ref 8–23)
CO2: 20 mmol/L — ABNORMAL LOW (ref 22–32)
Calcium: 8 mg/dL — ABNORMAL LOW (ref 8.9–10.3)
Chloride: 104 mmol/L (ref 98–111)
Creatinine, Ser: 0.87 mg/dL (ref 0.44–1.00)
GFR, Estimated: >60 mL/min (ref >60)
Glucose, Bld: 185 mg/dL — ABNORMAL HIGH (ref 70–99)
Potassium: 3.5 mmol/L (ref 3.5–5.1)
Sodium: 133 mmol/L — ABNORMAL LOW (ref 135–145)

## 2023-10-23 LAB — CBC
HCT: 32.3 % — ABNORMAL LOW (ref 36.0–46.0)
Hemoglobin: 11.5 g/dL — ABNORMAL LOW (ref 12.0–15.0)
MCH: 31.6 pg (ref 26.0–34.0)
MCHC: 35.6 g/dL (ref 30.0–36.0)
MCV: 88.7 fL (ref 80.0–100.0)
Platelets: 158 10*3/uL (ref 150–400)
RBC: 3.64 MIL/uL — ABNORMAL LOW (ref 3.87–5.11)
RDW: 13.2 % (ref 11.5–15.5)
WBC: 11.3 10*3/uL — ABNORMAL HIGH (ref 4.0–10.5)
nRBC: 0 % (ref 0.0–0.2)

## 2023-10-23 LAB — ABO/RH: ABO/RH(D): A POS

## 2023-10-23 LAB — GLUCOSE, CAPILLARY
Glucose-Capillary: 218 mg/dL — ABNORMAL HIGH (ref 70–99)
Glucose-Capillary: 209 mg/dL — ABNORMAL HIGH (ref 70–99)

## 2023-10-23 LAB — MRSA NEXT GEN BY PCR, NASAL: MRSA by PCR Next Gen: NOT DETECTED

## 2023-10-23 SURGERY — COLECTOMY, SIGMOID, ROBOT-ASSISTED
Anesthesia: General

## 2023-10-23 MED ORDER — BUPIVACAINE LIPOSOME 1.3 % IJ SUSP
INTRAMUSCULAR | Status: DC | PRN
  Administered 2023-10-23: 20 mL

## 2023-10-23 MED ORDER — DEXAMETHASONE SODIUM PHOSPHATE 10 MG/ML IJ SOLN
INTRAMUSCULAR | Status: AC
Start: 2023-10-23 — End: ?
  Filled 2023-10-23: qty 1

## 2023-10-23 MED ORDER — ENSURE PRE-SURGERY PO LIQD: 592.0000 mL | Freq: Once | ORAL | Status: DC

## 2023-10-23 MED ORDER — ONDANSETRON HCL 4 MG/2ML IJ SOLN
INTRAMUSCULAR | Status: DC | PRN
  Administered 2023-10-23: 4 mg via INTRAVENOUS

## 2023-10-23 MED ORDER — ROCURONIUM BROMIDE 100 MG/10ML IV SOLN
INTRAVENOUS | Status: DC | PRN
  Administered 2023-10-23: 10 mg via INTRAVENOUS
  Administered 2023-10-23: 60 mg via INTRAVENOUS

## 2023-10-23 MED ORDER — ONDANSETRON HCL 4 MG/2ML IJ SOLN
INTRAMUSCULAR | Status: AC
  Filled 2023-10-23: qty 2

## 2023-10-23 MED ORDER — FENTANYL CITRATE (PF) 100 MCG/2ML IJ SOLN
INTRAMUSCULAR | Status: AC
  Filled 2023-10-23: qty 2

## 2023-10-23 MED ORDER — ENSURE PRE-SURGERY PO LIQD: 296.0000 mL | Freq: Once | ORAL | Status: DC

## 2023-10-23 MED ORDER — LACTATED RINGERS IV SOLN: Freq: Three times a day (TID) | INTRAVENOUS | Status: AC | PRN

## 2023-10-23 MED ORDER — PROCHLORPERAZINE MALEATE 10 MG PO TABS: 10.0000 mg | ORAL_TABLET | Freq: Four times a day (QID) | ORAL | Status: DC | PRN

## 2023-10-23 MED ORDER — HYDROMORPHONE HCL 2 MG/ML IJ SOLN
INTRAMUSCULAR | Status: AC
  Filled 2023-10-23: qty 1

## 2023-10-23 MED ORDER — FENTANYL CITRATE (PF) 100 MCG/2ML IJ SOLN
INTRAMUSCULAR | Status: DC | PRN
  Administered 2023-10-23 (×2): 50 ug via INTRAVENOUS

## 2023-10-23 MED ORDER — HYDROMORPHONE HCL 1 MG/ML IJ SOLN
0.5000 mg | INTRAMUSCULAR | Status: DC | PRN
Start: 2023-10-23 — End: 2023-10-28

## 2023-10-23 MED ORDER — ONDANSETRON HCL 4 MG/2ML IJ SOLN: 4.0000 mg | Freq: Once | INTRAMUSCULAR | Status: DC | PRN

## 2023-10-23 MED ORDER — MENTHOL 3 MG MT LOZG: 1.0000 | LOZENGE | OROMUCOSAL | Status: DC | PRN

## 2023-10-23 MED ORDER — MAGIC MOUTHWASH: 15.0000 mL | Freq: Four times a day (QID) | ORAL | Status: DC | PRN

## 2023-10-23 MED ORDER — OXYCODONE HCL 5 MG PO TABS: 5.0000 mg | ORAL_TABLET | Freq: Once | ORAL | Status: DC | PRN

## 2023-10-23 MED ORDER — SODIUM CHLORIDE 0.9 % IV SOLN
2.0000 g | INTRAVENOUS | Status: AC
  Administered 2023-10-23: 2 g via INTRAVENOUS
  Filled 2023-10-23: qty 2

## 2023-10-23 MED ORDER — ORAL CARE MOUTH RINSE: 15.0000 mL | Freq: Once | OROMUCOSAL | Status: AC

## 2023-10-23 MED ORDER — POLYETHYLENE GLYCOL 3350 17 GM/SCOOP PO POWD: 1.0000 | Freq: Once | ORAL | Status: DC

## 2023-10-23 MED ORDER — STERILE WATER FOR IRRIGATION IR SOLN
Status: DC | PRN
  Administered 2023-10-23: 1000 mL

## 2023-10-23 MED ORDER — SODIUM CHLORIDE 0.9% FLUSH
3.0000 mL | Freq: Two times a day (BID) | INTRAVENOUS | Status: DC
  Administered 2023-10-23 – 2023-10-27 (×10): 3 mL via INTRAVENOUS

## 2023-10-23 MED ORDER — PHENYLEPHRINE HCL-NACL 20-0.9 MG/250ML-% IV SOLN
INTRAVENOUS | Status: DC | PRN
  Administered 2023-10-23: 20 ug/min via INTRAVENOUS

## 2023-10-23 MED ORDER — HYDRALAZINE HCL 20 MG/ML IJ SOLN: 10.0000 mg | INTRAMUSCULAR | Status: DC | PRN

## 2023-10-23 MED ORDER — CALCIUM POLYCARBOPHIL 625 MG PO TABS
625.0000 mg | ORAL_TABLET | Freq: Two times a day (BID) | ORAL | Status: DC
  Administered 2023-10-23 – 2023-10-28 (×7): 625 mg via ORAL
  Filled 2023-10-23 (×8): qty 1

## 2023-10-23 MED ORDER — ROCURONIUM BROMIDE 10 MG/ML (PF) SYRINGE
PREFILLED_SYRINGE | INTRAVENOUS | Status: AC
  Filled 2023-10-23: qty 10

## 2023-10-23 MED ORDER — OXYCODONE HCL 5 MG/5ML PO SOLN: 5.0000 mg | Freq: Once | ORAL | Status: DC | PRN

## 2023-10-23 MED ORDER — ACETAMINOPHEN 500 MG PO TABS
1000.0000 mg | ORAL_TABLET | ORAL | Status: AC
  Administered 2023-10-23: 1000 mg via ORAL
  Filled 2023-10-23: qty 2

## 2023-10-23 MED ORDER — METHOCARBAMOL 500 MG PO TABS: 500.0000 mg | ORAL_TABLET | Freq: Four times a day (QID) | ORAL | Status: DC | PRN

## 2023-10-23 MED ORDER — SALINE SPRAY 0.65 % NA SOLN
1.0000 | Freq: Four times a day (QID) | NASAL | Status: DC | PRN
Start: 2023-10-23 — End: 2023-10-28

## 2023-10-23 MED ORDER — METHOCARBAMOL 1000 MG/10ML IJ SOLN: 500.0000 mg | Freq: Four times a day (QID) | INTRAMUSCULAR | Status: DC | PRN

## 2023-10-23 MED ORDER — STERILE WATER FOR INJECTION IJ SOLN
INTRAMUSCULAR | Status: AC
  Filled 2023-10-23: qty 10

## 2023-10-23 MED ORDER — LACTATED RINGERS IV SOLN: INTRAVENOUS | Status: DC

## 2023-10-23 MED ORDER — ACETAMINOPHEN 500 MG PO TABS
1000.0000 mg | ORAL_TABLET | Freq: Four times a day (QID) | ORAL | Status: DC
  Administered 2023-10-23 – 2023-10-28 (×17): 1000 mg via ORAL
  Filled 2023-10-23 (×17): qty 2

## 2023-10-23 MED ORDER — ENSURE SURGERY PO LIQD
237.0000 mL | Freq: Two times a day (BID) | ORAL | Status: DC
Start: 2023-10-23 — End: 2023-10-27
  Filled 2023-10-23 (×8): qty 237

## 2023-10-23 MED ORDER — 0.9 % SODIUM CHLORIDE (POUR BTL) OPTIME
TOPICAL | Status: DC | PRN
  Administered 2023-10-23: 1000 mL

## 2023-10-23 MED ORDER — PHENYLEPHRINE 80 MCG/ML (10ML) SYRINGE FOR IV PUSH (FOR BLOOD PRESSURE SUPPORT)
PREFILLED_SYRINGE | INTRAVENOUS | Status: AC
  Filled 2023-10-23: qty 10

## 2023-10-23 MED ORDER — PROCHLORPERAZINE EDISYLATE 10 MG/2ML IJ SOLN: 5.0000 mg | Freq: Four times a day (QID) | INTRAMUSCULAR | Status: DC | PRN

## 2023-10-23 MED ORDER — PHENOL 1.4 % MT LIQD: 2.0000 | OROMUCOSAL | Status: DC | PRN

## 2023-10-23 MED ORDER — GABAPENTIN 100 MG PO CAPS
200.0000 mg | ORAL_CAPSULE | ORAL | Status: AC
  Administered 2023-10-23: 200 mg via ORAL
  Filled 2023-10-23: qty 2

## 2023-10-23 MED ORDER — KETAMINE HCL 50 MG/5ML IJ SOSY
PREFILLED_SYRINGE | INTRAMUSCULAR | Status: AC
  Filled 2023-10-23: qty 5

## 2023-10-23 MED ORDER — SODIUM CHLORIDE 0.9 % IV SOLN
250.0000 mL | INTRAVENOUS | Status: DC | PRN
  Administered 2023-10-26: 250 mL via INTRAVENOUS

## 2023-10-23 MED ORDER — LACTATED RINGERS IR SOLN
Status: DC | PRN
  Administered 2023-10-23: 1000 mL

## 2023-10-23 MED ORDER — DEXAMETHASONE SODIUM PHOSPHATE 10 MG/ML IJ SOLN
INTRAMUSCULAR | Status: DC | PRN
  Administered 2023-10-23: 8 mg via INTRAVENOUS

## 2023-10-23 MED ORDER — ADULT MULTIVITAMIN W/MINERALS CH
1.0000 | ORAL_TABLET | Freq: Every day | ORAL | Status: DC
Start: 2023-10-23 — End: 2023-10-28
  Administered 2023-10-23 – 2023-10-28 (×6): 1 via ORAL
  Filled 2023-10-23 (×6): qty 1

## 2023-10-23 MED ORDER — BISMUTH SUBSALICYLATE 262 MG/15ML PO SUSP: 30.0000 mL | Freq: Three times a day (TID) | ORAL | Status: DC | PRN

## 2023-10-23 MED ORDER — BISACODYL 5 MG PO TBEC: 20.0000 mg | DELAYED_RELEASE_TABLET | Freq: Once | ORAL | Status: DC

## 2023-10-23 MED ORDER — CHLORHEXIDINE GLUCONATE 0.12 % MT SOLN
15.0000 mL | Freq: Once | OROMUCOSAL | Status: AC
  Administered 2023-10-23: 15 mL via OROMUCOSAL

## 2023-10-23 MED ORDER — SIMETHICONE 80 MG PO CHEW: 40.0000 mg | CHEWABLE_TABLET | Freq: Four times a day (QID) | ORAL | Status: DC | PRN

## 2023-10-23 MED ORDER — SODIUM CHLORIDE (PF) 0.9 % IJ SOLN
INTRAMUSCULAR | Status: AC
  Filled 2023-10-23: qty 20

## 2023-10-23 MED ORDER — ALVIMOPAN 12 MG PO CAPS
12.0000 mg | ORAL_CAPSULE | Freq: Two times a day (BID) | ORAL | Status: DC
  Administered 2023-10-24: 12 mg via ORAL
  Filled 2023-10-23 (×5): qty 1

## 2023-10-23 MED ORDER — CHLORHEXIDINE GLUCONATE CLOTH 2 % EX PADS
6.0000 | MEDICATED_PAD | Freq: Every day | CUTANEOUS | Status: DC
  Administered 2023-10-23 – 2023-10-27 (×5): 6 via TOPICAL

## 2023-10-23 MED ORDER — PROPOFOL 10 MG/ML IV BOLUS
INTRAVENOUS | Status: DC | PRN
  Administered 2023-10-23: 100 mg via INTRAVENOUS

## 2023-10-23 MED ORDER — SUGAMMADEX SODIUM 200 MG/2ML IV SOLN
INTRAVENOUS | Status: DC | PRN
  Administered 2023-10-23: 150 mg via INTRAVENOUS

## 2023-10-23 MED ORDER — SODIUM CHLORIDE 0.9% FLUSH
3.0000 mL | INTRAVENOUS | Status: DC | PRN
  Administered 2023-10-26: 3 mL via INTRAVENOUS

## 2023-10-23 MED ORDER — POTASSIUM CHLORIDE 2 MEQ/ML IV SOLN
INTRAVENOUS | Status: DC
  Filled 2023-10-23: qty 1000

## 2023-10-23 MED ORDER — HYDROMORPHONE HCL 1 MG/ML IJ SOLN
INTRAMUSCULAR | Status: DC | PRN
  Administered 2023-10-23: .5 mg via INTRAVENOUS

## 2023-10-23 MED ORDER — LIDOCAINE HCL (CARDIAC) PF 100 MG/5ML IV SOSY
PREFILLED_SYRINGE | INTRAVENOUS | Status: DC | PRN
  Administered 2023-10-23: 60 mg via INTRAVENOUS

## 2023-10-23 MED ORDER — PHENYLEPHRINE HCL (PRESSORS) 10 MG/ML IV SOLN
INTRAVENOUS | Status: DC | PRN
  Administered 2023-10-23: 80 ug via INTRAVENOUS

## 2023-10-23 MED ORDER — HYDROMORPHONE HCL 1 MG/ML IJ SOLN: 0.2500 mg | INTRAMUSCULAR | Status: DC | PRN

## 2023-10-23 MED ORDER — TRAMADOL HCL 50 MG PO TABS
50.0000 mg | ORAL_TABLET | Freq: Four times a day (QID) | ORAL | Status: DC | PRN
  Filled 2023-10-23: qty 1

## 2023-10-23 MED ORDER — BUPIVACAINE LIPOSOME 1.3 % IJ SUSP: 20.0000 mL | Freq: Once | INTRAMUSCULAR | Status: DC

## 2023-10-23 MED ORDER — PROPOFOL 10 MG/ML IV BOLUS
INTRAVENOUS | Status: AC
  Filled 2023-10-23: qty 20

## 2023-10-23 MED ORDER — PHENYLEPHRINE HCL (PRESSORS) 10 MG/ML IV SOLN
INTRAVENOUS | Status: AC
  Filled 2023-10-23: qty 1

## 2023-10-23 MED ORDER — BUPIVACAINE-EPINEPHRINE 0.25% -1:200000 IJ SOLN
INTRAMUSCULAR | Status: AC
  Filled 2023-10-23: qty 1

## 2023-10-23 MED ORDER — ALVIMOPAN 12 MG PO CAPS
12.0000 mg | ORAL_CAPSULE | ORAL | Status: AC
  Administered 2023-10-23: 12 mg via ORAL
  Filled 2023-10-23: qty 1

## 2023-10-23 MED ORDER — METOPROLOL TARTRATE 5 MG/5ML IV SOLN: 5.0000 mg | Freq: Four times a day (QID) | INTRAVENOUS | Status: DC | PRN

## 2023-10-23 MED ORDER — ALUM & MAG HYDROXIDE-SIMETH 200-200-20 MG/5ML PO SUSP: 30.0000 mL | Freq: Four times a day (QID) | ORAL | Status: DC | PRN

## 2023-10-23 MED ORDER — ENOXAPARIN SODIUM 40 MG/0.4ML IJ SOSY
40.0000 mg | PREFILLED_SYRINGE | INTRAMUSCULAR | Status: DC
  Administered 2023-10-24: 40 mg via SUBCUTANEOUS
  Filled 2023-10-23: qty 0.4

## 2023-10-23 MED ORDER — SODIUM CHLORIDE 0.9 % IV SOLN
2.0000 g | Freq: Two times a day (BID) | INTRAVENOUS | Status: AC
  Administered 2023-10-23: 2 g via INTRAVENOUS
  Filled 2023-10-23 (×2): qty 2

## 2023-10-23 MED ORDER — INDOCYANINE GREEN 25 MG IV SOLR
INTRAVENOUS | Status: DC | PRN
  Administered 2023-10-23: 6.25 mg via INTRAVENOUS

## 2023-10-23 MED ORDER — BUPIVACAINE LIPOSOME 1.3 % IJ SUSP
INTRAMUSCULAR | Status: AC
  Filled 2023-10-23: qty 20

## 2023-10-23 MED ORDER — ENOXAPARIN SODIUM 40 MG/0.4ML IJ SOSY
40.0000 mg | PREFILLED_SYRINGE | Freq: Once | INTRAMUSCULAR | Status: AC
  Administered 2023-10-23: 40 mg via SUBCUTANEOUS
  Filled 2023-10-23: qty 0.4

## 2023-10-23 MED ORDER — KETAMINE HCL 10 MG/ML IJ SOLN
INTRAMUSCULAR | Status: DC | PRN
  Administered 2023-10-23: 30 mg via INTRAVENOUS

## 2023-10-23 MED ORDER — NEOMYCIN SULFATE 500 MG PO TABS: 1000.0000 mg | ORAL_TABLET | ORAL | Status: DC

## 2023-10-23 MED ORDER — ONDANSETRON HCL 4 MG/2ML IJ SOLN
4.0000 mg | Freq: Four times a day (QID) | INTRAMUSCULAR | Status: DC | PRN
  Filled 2023-10-23: qty 2

## 2023-10-23 MED ORDER — MIDAZOLAM HCL 2 MG/2ML IJ SOLN
INTRAMUSCULAR | Status: AC
  Filled 2023-10-23: qty 2

## 2023-10-23 MED ORDER — METRONIDAZOLE 500 MG PO TABS: 1000.0000 mg | ORAL_TABLET | ORAL | Status: DC

## 2023-10-23 MED ORDER — ONDANSETRON HCL 4 MG PO TABS
4.0000 mg | ORAL_TABLET | Freq: Four times a day (QID) | ORAL | Status: DC | PRN
Start: 2023-10-23 — End: 2023-10-28

## 2023-10-23 MED ORDER — BUPIVACAINE-EPINEPHRINE (PF) 0.25% -1:200000 IJ SOLN
INTRAMUSCULAR | Status: DC | PRN
  Administered 2023-10-23: 50 mL

## 2023-10-23 MED ORDER — NAPHAZOLINE-GLYCERIN 0.012-0.25 % OP SOLN
1.0000 [drp] | Freq: Four times a day (QID) | OPHTHALMIC | Status: DC | PRN
Start: 2023-10-23 — End: 2023-10-28

## 2023-10-23 MED ORDER — VITAMIN D 25 MCG (1000 UNIT) PO TABS
1000.0000 [IU] | ORAL_TABLET | Freq: Every day | ORAL | Status: DC
  Administered 2023-10-23 – 2023-10-28 (×6): 1000 [IU] via ORAL
  Filled 2023-10-23 (×6): qty 1

## 2023-10-23 MED ORDER — MELATONIN 3 MG PO TABS: 3.0000 mg | ORAL_TABLET | Freq: Every evening | ORAL | Status: DC | PRN

## 2023-10-23 SURGICAL SUPPLY — 109 items
ADAPTER GOLDBERG URETERAL (ADAPTER) IMPLANT
ADPR CATH 15X14FR FL DRN BG (ADAPTER)
APL PRP STRL LF DISP 70% ISPRP (MISCELLANEOUS)
APPLIER CLIP 5 13 M/L LIGAMAX5 (MISCELLANEOUS)
APPLIER CLIP ROT 10 11.4 M/L (STAPLE)
APR CLP MED LRG 11.4X10 (STAPLE)
APR CLP MED LRG 5 ANG JAW (MISCELLANEOUS)
BAG COUNTER SPONGE SURGICOUNT (BAG) ×1 IMPLANT
BAG SPNG CNTER NS LX DISP (BAG) ×1
BAG URO CATCHER STRL LF (MISCELLANEOUS) ×1 IMPLANT
BLADE EXTENDED COATED 6.5IN (ELECTRODE) IMPLANT
CANNULA REDUCER 12-8 DVNC XI (CANNULA) IMPLANT
CATH URETL OPEN 5X70 (CATHETERS) IMPLANT
CHLORAPREP W/TINT 26 (MISCELLANEOUS) IMPLANT
CLIP APPLIE 5 13 M/L LIGAMAX5 (MISCELLANEOUS) IMPLANT
CLIP APPLIE ROT 10 11.4 M/L (STAPLE) IMPLANT
CLOTH BEACON ORANGE TIMEOUT ST (SAFETY) ×1 IMPLANT
COVER SURGICAL LIGHT HANDLE (MISCELLANEOUS) ×2 IMPLANT
COVER TIP SHEARS 8 DVNC (MISCELLANEOUS) ×1 IMPLANT
DEVICE TROCAR PUNCTURE CLOSURE (ENDOMECHANICALS) IMPLANT
DRAIN CHANNEL 19F RND (DRAIN) IMPLANT
DRAPE ARM DVNC X/XI (DISPOSABLE) ×4 IMPLANT
DRAPE COLUMN DVNC XI (DISPOSABLE) ×1 IMPLANT
DRAPE SURG IRRIG POUCH 19X23 (DRAPES) ×1 IMPLANT
DRIVER NDL LRG 8 DVNC XI (INSTRUMENTS) ×1 IMPLANT
DRIVER NDLE LRG 8 DVNC XI (INSTRUMENTS) ×1
DRSG OPSITE POSTOP 4X10 (GAUZE/BANDAGES/DRESSINGS) IMPLANT
DRSG OPSITE POSTOP 4X6 (GAUZE/BANDAGES/DRESSINGS) IMPLANT
DRSG OPSITE POSTOP 4X8 (GAUZE/BANDAGES/DRESSINGS) IMPLANT
DRSG TEGADERM 2-3/8X2-3/4 SM (GAUZE/BANDAGES/DRESSINGS) ×5 IMPLANT
DRSG TEGADERM 4X4.75 (GAUZE/BANDAGES/DRESSINGS) IMPLANT
ELECT PENCIL ROCKER SW 15FT (MISCELLANEOUS) ×1 IMPLANT
ELECT REM PT RETURN 15FT ADLT (MISCELLANEOUS) ×1 IMPLANT
ENDOLOOP SUT PDS II 0 18 (SUTURE) IMPLANT
EVACUATOR SILICONE 100CC (DRAIN) IMPLANT
GAUZE SPONGE 2X2 8PLY STRL LF (GAUZE/BANDAGES/DRESSINGS) ×1 IMPLANT
GLOVE ECLIPSE 8.0 STRL XLNG CF (GLOVE) ×3 IMPLANT
GLOVE INDICATOR 8.0 STRL GRN (GLOVE) ×3 IMPLANT
GLOVE SURG LX STRL 7.5 STRW (GLOVE) ×1 IMPLANT
GOWN SRG XL LVL 4 BRTHBL STRL (GOWNS) ×1 IMPLANT
GOWN STRL NON-REIN XL LVL4 (GOWNS) ×1
GOWN STRL REUS W/ TWL XL LVL3 (GOWN DISPOSABLE) ×5 IMPLANT
GOWN STRL REUS W/TWL XL LVL3 (GOWN DISPOSABLE) ×5
GRASPER SUT TROCAR 14GX15 (MISCELLANEOUS) IMPLANT
GRASPER TIP-UP FEN DVNC XI (INSTRUMENTS) ×1 IMPLANT
GUIDEWIRE ANG ZIPWIRE 038X150 (WIRE) IMPLANT
GUIDEWIRE STR DUAL SENSOR (WIRE) IMPLANT
HOLDER FOLEY CATH W/STRAP (MISCELLANEOUS) ×1 IMPLANT
IRRIG SUCT STRYKERFLOW 2 WTIP (MISCELLANEOUS) ×1
IRRIGATION SUCT STRKRFLW 2 WTP (MISCELLANEOUS) ×1 IMPLANT
KIT PROCEDURE DVNC SI (MISCELLANEOUS) ×1 IMPLANT
KIT SIGMOIDOSCOPE (SET/KITS/TRAYS/PACK) IMPLANT
KIT TURNOVER KIT A (KITS) IMPLANT
MANIFOLD NEPTUNE II (INSTRUMENTS) ×1 IMPLANT
NDL INSUFFLATION 14GA 120MM (NEEDLE) ×1 IMPLANT
NEEDLE INSUFFLATION 14GA 120MM (NEEDLE) ×1
PACK CARDIOVASCULAR III (CUSTOM PROCEDURE TRAY) ×1 IMPLANT
PACK COLON (CUSTOM PROCEDURE TRAY) ×1 IMPLANT
PACK CYSTO (CUSTOM PROCEDURE TRAY) ×1 IMPLANT
PAD POSITIONING PINK XL (MISCELLANEOUS) ×1 IMPLANT
PAD PREP 24X48 CUFFED NSTRL (MISCELLANEOUS) ×1 IMPLANT
PROTECTOR NERVE ULNAR (MISCELLANEOUS) ×2 IMPLANT
RELOAD STAPLE 45 3.5 BLU DVNC (STAPLE) IMPLANT
RELOAD STAPLE 45 4.3 GRN DVNC (STAPLE) IMPLANT
RELOAD STAPLE 60 3.5 BLU DVNC (STAPLE) IMPLANT
RELOAD STAPLE 60 4.3 GRN DVNC (STAPLE) IMPLANT
RETRACTOR WND ALEXIS 18 MED (MISCELLANEOUS) IMPLANT
RTRCTR WOUND ALEXIS 18CM MED (MISCELLANEOUS)
SCISSORS LAP 5X35 DISP (ENDOMECHANICALS) ×1 IMPLANT
SCISSORS MNPLR CVD DVNC XI (INSTRUMENTS) ×1 IMPLANT
SEAL UNIV 5-12 XI (MISCELLANEOUS) ×4 IMPLANT
SEALER VESSEL EXT DVNC XI (MISCELLANEOUS) ×1 IMPLANT
SOL ELECTROSURG ANTI STICK (MISCELLANEOUS) ×1
SOLUTION ELECTROSURG ANTI STCK (MISCELLANEOUS) ×1 IMPLANT
SPIKE FLUID TRANSFER (MISCELLANEOUS) ×1 IMPLANT
STAPLER 45 SUREFORM DVNC (STAPLE) IMPLANT
STAPLER 60 SUREFORM DVNC (STAPLE) IMPLANT
STAPLER ECHELON POWER CIR 29 (STAPLE) IMPLANT
STAPLER ECHELON POWER CIR 31 (STAPLE) IMPLANT
STAPLER RELOAD 3.5X45 BLU DVNC (STAPLE)
STAPLER RELOAD 3.5X60 BLU DVNC (STAPLE)
STAPLER RELOAD 4.3X45 GRN DVNC (STAPLE)
STAPLER RELOAD 4.3X60 GRN DVNC (STAPLE) ×1
STOPCOCK 4 WAY LG BORE MALE ST (IV SETS) ×2 IMPLANT
SURGILUBE 2OZ TUBE FLIPTOP (MISCELLANEOUS) IMPLANT
SUT MNCRL AB 4-0 PS2 18 (SUTURE) ×1 IMPLANT
SUT PDS AB 1 CT1 27 (SUTURE) ×2 IMPLANT
SUT PROLENE 0 CT 2 (SUTURE) IMPLANT
SUT PROLENE 2 0 KS (SUTURE) IMPLANT
SUT PROLENE 2 0 SH DA (SUTURE) IMPLANT
SUT SILK 2 0 SH CR/8 (SUTURE) IMPLANT
SUT SILK 3 0 SH CR/8 (SUTURE) ×1 IMPLANT
SUT V-LOC BARB 180 2/0GR6 GS22 (SUTURE)
SUT VIC AB 3-0 SH 18 (SUTURE) IMPLANT
SUT VIC AB 3-0 SH 27 (SUTURE)
SUT VIC AB 3-0 SH 27XBRD (SUTURE) IMPLANT
SUT VICRYL 0 UR6 27IN ABS (SUTURE) IMPLANT
SUTURE V-LC BRB 180 2/0GR6GS22 (SUTURE) IMPLANT
SYR 20ML ECCENTRIC (SYRINGE) ×1 IMPLANT
SYS LAPSCP GELPORT 120MM (MISCELLANEOUS)
SYS WOUND ALEXIS 18CM MED (MISCELLANEOUS) ×1
SYSTEM LAPSCP GELPORT 120MM (MISCELLANEOUS) IMPLANT
SYSTEM WOUND ALEXIS 18CM MED (MISCELLANEOUS) ×1 IMPLANT
TOWEL OR NON WOVEN STRL DISP B (DISPOSABLE) ×1 IMPLANT
TRAY FOLEY MTR SLVR 16FR STAT (SET/KITS/TRAYS/PACK) ×1 IMPLANT
TROCAR ADV FIXATION 5X100MM (TROCAR) ×1 IMPLANT
TUBING CONNECTING 10 (TUBING) ×3 IMPLANT
TUBING INSUFFLATION 10FT LAP (TUBING) ×1 IMPLANT
TUBING UROLOGY SET (TUBING) IMPLANT

## 2023-10-23 NOTE — H&P (Signed)
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  She is scheduled for sigmoid resection and fistula takedown.  As part of the surgery Dr. Michaell Cowing is asked for injection of firefly contrast to help facilitate the surgery.  The patient has no real significant past urologic history.  She has had UTIs in the past.  She denies any hematuria.  She denies any kidney pain or dysfunction.   Review of systems: A 12 point comprehensive review of systems was obtained and is negative unless otherwise stated in the history of present illness.  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   Current Outpatient Medications on File Prior to Encounter  Medication Sig Dispense Refill   CALCIUM PO Take 1 tablet by mouth in the morning and at bedtime.     cholecalciferol (VITAMIN D3) 25 MCG (1000 UNIT) tablet Take 1,000 Units by mouth daily.     Cod Liver Oil CAPS Take 1 capsule by mouth daily.     Flaxseed, Linseed, (FLAXSEED OIL PO) Take 5 mLs by mouth 2 (two) times a week.     Multiple Vitamin (MULTIVITAMIN WITH MINERALS) TABS tablet Take 1 tablet by mouth daily.     vitamin E 180 MG (400 UNITS) capsule Take 400 Units by mouth daily.      Past Medical History:  Diagnosis Date   Osteoporosis    Pre-diabetes    Vitamin D deficiency     Past Surgical History:  Procedure Laterality Date   ABDOMINAL HYSTERECTOMY     COLONOSCOPY     COLONOSCOPY WITH PROPOFOL N/A 07/22/2023   Procedure: COLONOSCOPY WITH PROPOFOL;  Surgeon: Lanelle Bal, DO;  Location: [facility];  Service: Endoscopy;  Laterality: N/A;  12:15pm, asa 2   cyst removed      Social History   Tobacco Use   Smoking status: Never    Smokeless tobacco: Never  Vaping Use   Vaping status: Never Used  Substance Use Topics   Alcohol use: Never   Drug use: Never    Family History  Problem Relation Age of Onset   Stroke Mother    Stroke Father    Hypertension Father    Colon cancer Neg Hx     PE: Vitals:   10/23/23 0629  BP: 128/84  Pulse: 79  Resp: 16  Temp: 98 F (36.7 C)  TempSrc: Oral  SpO2: 98%   Patient appears to be in no acute distress  patient is alert and oriented x3 Atraumatic normocephalic head No cervical or supraclavicular lymphadenopathy appreciated No increased work of breathing, no audible wheezes/rhonchi Regular sinus rhythm/rate Abdomen is soft, nontender, nondistended, no CVA or suprapubic tenderness Lower extremities are symmetric without appreciable edema Grossly neurologically intact No identifiable skin lesions  No results for input(s): "WBC", "HGB", "HCT" in the last 72 hours. No results for input(s): "NA", "K", "CL", "CO2", "GLUCOSE", "BUN", "CREATININE", "CALCIUM" in the last 72 hours. No results for input(s): "LABPT", "INR" in the last 72 hours. No results for input(s): "LABURIN" in the last 72 hours. No results found for this or any previous visit.  Imaging: I reviewed the patient's CT scan that was performed this summer that demonstrates a small fluid collection at  the sigmoid near the vagina.  There is no hydroureteronephrosis.   Imp: Colovaginal fistula in the area of the urinary tract, adjacent to the bladder and left ureter.  Recommendations: Plan to perform cystoscopy with injection of firefly for Dr. Michaell Cowing.  I spoke to the patient about the surgery.  She understands my part of it.  She understands after my partner Dr. Michaell Cowing will assume the remainder of the surgery.  Having gone through the surgery with her she has opted to proceed.   Crist Fat

## 2023-10-23 NOTE — Interval H&P Note (Signed)
History and Physical Interval Note:  10/23/2023 7:11 AM  Kylie Casey  has presented today for surgery, with the diagnosis of COLOVAGINAL FISTULA.  The various methods of treatment have been discussed with the patient and family. After consideration of risks, benefits and other options for treatment, the patient has consented to  Procedure(s) with comments: ROBOTIC RESECTION OF COLON RECTOSIGMOID WITH POSSIBLE VAGINAL REPAIR (N/A) - 3.5 HOURS TOTAL INCLUDING [facility] TIME RIGID PROCTOSCOPY (N/A) CYSTOSCOPY with FIREFLY INJECTION (N/A) as a surgical intervention.  The patient's history has been reviewed, patient examined, no change in status, stable for surgery.  I have reviewed the patient's chart and labs.  Questions were answered to the patient's satisfaction.    I have re-reviewed the the patient's records, history, medications, and allergies.  I have re-examined the patient.  I again discussed intraoperative plans and goals of post-operative recovery.  The patient agrees to proceed.  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   Bowel trouble 06/02/2023    Past Medical History:  Diagnosis Date   Osteoporosis    Pre-diabetes    Vitamin D deficiency     Past Surgical History:  Procedure Laterality Date   ABDOMINAL HYSTERECTOMY     COLONOSCOPY     COLONOSCOPY WITH PROPOFOL N/A 07/22/2023   Procedure: COLONOSCOPY WITH PROPOFOL;  Surgeon: Lanelle Bal, DO;  Location: [facility];  Service: Endoscopy;  Laterality: N/A;  12:15pm, asa 2   cyst removed      Social History   Socioeconomic  History   Marital status: Single    Spouse name: Not on file   Number of children: 2   Years of education: Not on file   Highest education level: Not on file  Occupational History   Occupation: retired  Tobacco Use   Smoking status: Never   Smokeless tobacco: Never  Vaping Use   Vaping status: Never Used  Substance and Sexual Activity   Alcohol use: Never   Drug use: Never   Sexual activity: Not on file  Other Topics Concern   Not on file  Social History Narrative   Never married, 2 daughters   Social Determinants of Health   Financial Resource Strain: Low Risk  (10/15/2023)   Overall Financial Resource Strain (CARDIA)    Difficulty of Paying Living Expenses: Not hard at all  Food Insecurity: No Food Insecurity (10/15/2023)   Hunger Vital Sign    Worried About Running Out of Food in the Last Year: Never true    Ran Out of Food in the Last Year: Never true  Transportation Needs: No Transportation Needs (10/15/2023)   PRAPARE - Administrator, Civil Service (Medical): No    Lack of Transportation (Non-Medical): No  Physical Activity: Insufficiently Active (10/15/2023)   Exercise Vital Sign    Days of Exercise per Week: 2 days    Minutes of Exercise per Session: 30 min  Stress: No Stress Concern Present (10/15/2023)   [employer] of Occupational Health - Occupational Stress Questionnaire    Feeling of Stress : Only a little  Social Connections: Socially Isolated (10/15/2023)   Social  Connection and Isolation Panel [NHANES]    Frequency of Communication with Friends and Family: More than three times a week    Frequency of Social Gatherings with Friends and Family: More than three times a week    Attends Religious Services: Never    Database administrator or Organizations: No    Attends Banker Meetings: Never    Marital Status: Never married  Intimate Partner Violence: Not At Risk (10/15/2023)   Humiliation, Afraid, Rape, and Kick  questionnaire    Fear of Current or Ex-Partner: No    Emotionally Abused: No    Physically Abused: No    Sexually Abused: No    Family History  Problem Relation Age of Onset   Stroke Mother    Stroke Father    Hypertension Father    Colon cancer Neg Hx     Medications Prior to Admission  Medication Sig Dispense Refill Last Dose   CALCIUM PO Take 1 tablet by mouth in the morning and at bedtime.   Past Week   cholecalciferol (VITAMIN D3) 25 MCG (1000 UNIT) tablet Take 1,000 Units by mouth daily.   Past Week   Cod Liver Oil CAPS Take 1 capsule by mouth daily.   Past Week   Flaxseed, Linseed, (FLAXSEED OIL PO) Take 5 mLs by mouth 2 (two) times a week.   Past Week   Multiple Vitamin (MULTIVITAMIN WITH MINERALS) TABS tablet Take 1 tablet by mouth daily.   Past Week   risedronate (ACTONEL) 35 MG tablet Take 1 tablet (35 mg total) by mouth every 7 (seven) days. with water on empty stomach, nothing by mouth or lie down for next 30 minutes. 30 tablet 0 Past Week   vitamin E 180 MG (400 UNITS) capsule Take 400 Units by mouth daily.   Past Week    Current Facility-Administered Medications  Medication Dose Route Frequency Provider Last Rate Last Admin   bupivacaine liposome (EXPAREL) 1.3 % injection 266 mg  20 mL Infiltration Once Karie Soda, MD       cefoTEtan (CEFOTAN) 2 g in sodium chloride 0.9 % 100 mL IVPB  2 g Intravenous On Call to OR Karie Soda, MD       lactated ringers infusion   Intravenous Continuous Collene Schlichter, MD 10 mL/hr at 10/23/23 0638 New Bag at 10/23/23 1610     No Known Allergies  BP 128/84   Pulse 79   Temp 98 F (36.7 C) (Oral)   Resp 16   SpO2 98%   Labs: No results found for this or any previous visit (from the past 48 hour(s)).  Imaging / Studies: No results found.   Ardeth Sportsman, M.D., F.A.C.S. Gastrointestinal and Minimally Invasive Surgery [facility] [address], [address] [place], [place] [address] [phone] Main / Paging  10/23/2023 7:11 AM     Ardeth Sportsman

## 2023-10-23 NOTE — Op Note (Signed)
10/23/2023  10:51 AM  PATIENT:  Kylie Casey  77 y.o. female  Patient Care Team: Gilmore Laroche, FNP as PCP - General (Family Medicine) Karie Soda, MD as Consulting Physician (General Surgery) Lanelle Bal, DO as Consulting Physician (Gastroenterology)  PRE-OPERATIVE DIAGNOSIS: COLOVAGINAL FISTULA  POST-OPERATIVE DIAGNOSIS:   COLOVAGINAL FISTULA OVARIAN CYST - LEFT  PROCEDURE:   ROBOTIC LOW ANTERIOR RECTOSIGMOID RESECTION TAKEDOWN & REPAIR OF COLOVAGINAL FISTULA EN-BLOC LEFT SALPINGO-OOPHORECTOMY INTRAOPERATIVE ASSESSMENT OF TISSUE VASCULAR PERFUSION USING ICG (indocyanine green) IMMUNOFLUORESCENCE TRANSVERSUS ABDOMINIS PLANE (TAP) BLOCK - BILATERAL FLEXIBLE SIGMOIDOSCOPY  SURGEON:  Ardeth Sportsman, MD  ASSISTANT:  Romie Levee, MD  An experienced assistant was required given the standard of surgical care given the complexity of the case.  This assistant was needed for exposure, dissection, suction, tissue approximation, retraction, perception, etc  ANESTHESIA:  General endotracheal intubation anesthesia (GETA) and Regional TRANSVERSUS ABDOMINIS PLANE (TAP) nerve block -BILATERAL for perioperative & postoperative pain control at the level of the transverse abdominis & preperitoneal spaces along the flank at the anterior axillary line, from subcostal ridge to iliac crest under laparoscopic guidance provided with liposomal bupivacaine (Experel) 20mL mixed with 50 mL of bupivicaine 0.25% with epinephrine  Estimated Blood Loss (EBL):   Total I/O In: 800 [I.V.:700; IV Piggyback:100] Out: 250 [Urine:100; Blood:150].   (See anesthesia record)  Delay start of Pharmacological VTE agent (>24hrs) due to concerns of significant anemia, surgical blood loss, or risk of bleeding?:  no  DRAINS: (None)  SPECIMEN:  Rectosigmoid (open end proximal) and Distal anastomotic ring (FINAL DISTAL MARGIN)  DISPOSITION OF SPECIMEN:  Pathology  COUNTS:  Sponge, needle, & instrument counts  CORRECT  PLAN OF CARE: Admit to inpatient   PATIENT DISPOSITION:  PACU - hemodynamically stable.  INDICATION:    Abnormal and history of prior hysterectomy who began to have feculent drainage around vagina with recurrent UTIs.  Had found to have colovaginal fistula.  Had colonoscopy in the summer which showed no malignancy.  Cleared medically.  I recommended segmental resection and takedown of colovaginal fistula  The anatomy & physiology of the digestive tract was discussed.  The pathophysiology of colorectal fistula to the vagina was discussed.  Natural history risks without surgery was discussed. I worked to give an overview of the disease and the frequent need to have multispecialty involvement.   I feel the risks of no intervention will lead to serious problems that outweigh the operative risks; therefore, I recommended surgery to treat the pathology.  Minimally Invasive (robotic/laparoscopic) & open techniques for probable colectomy were discussed.  Repair of the vaginal opening with possible need for intervening material to prevent recurrence such as omentum, muscle, mesh, etc was discussed.  Possible fecal diversion by ostomy was discussed.  We will work to preserve anal & pelvic floor function without sacrificing cure.  Risks such as bleeding, infection, abscess, leak, injury to other organs, need for repair of tissues / organs, recurrence with reoperation, possible ostomy, hernia, heart attack, death, and other risks were discussed.  I noted a good likelihood this will help address the problem.   Goals of post-operative recovery were discussed as well.  We will work to minimize complications.  Educational information was given as well.  Questions were answered.    The patient expresses understanding & wishes to proceed with surgery.   OR FINDINGS:   Patient had thickened inflamed colon with dense adhesions of rectosigmoid to left lateral vaginal cuff and left adnexa with enlarged but  simple  ovarian cyst.  En bloc left salpingo-oophorectomy done.  Resection needed to go down to mid rectum for good margins.  Primary repair of colovaginal fistula done  No obvious metastatic disease on visceral parietal peritoneum or liver.  It is a 29mm EEA anastomosis ( distal descending colon  connected to mid rectum.)  It rests 7 cm from the anal verge by rigid proctoscopy.  CASE DATA: Type of patient?: Elective [facility] Private Case Status of Case? Elective Scheduled Infection Present At Time Of Surgery (PATOS)?  NO   DESCRIPTION:   Informed consent was confirmed.  The patient underwent general anaesthesia without difficulty.  The patient was positioned appropriately.  VTE prevention in place.  Patient underwent cystoscopy by Dr. Marlou Porch with [facility] for instillation of ICG firefly per my request for genitourinary anatomy identification.  The patient was clipped, prepped, & draped in a sterile fashion.  Surgical timeout confirmed our plan.  The patient was positioned in reverse Trendelenburg.  Abdominal entry was gained using Varess technique at the left subcostal ridge on the anterior abdominal wall.  No elevated EtCO2 noted.  Port placed.  Camera inspection revealed no injury.  Extra ports were carefully placed under direct laparoscopic visualization. Upon entering the abdomen (organ space),we encountered a phlegmon involving the rectosigmoid colon .   I reflected the greater omentum and the upper abdomen the small bowel in the upper abdomen.  The patient was carefully positioned.  The Intuitive daVinci robot was docked with camera & instruments carefully placed.  Freed omental adhesions to the pelvis and anterior abdominal wall.  Freed omental adhesions to the sigmoid colon down to the rectosigmoid and anterior pelvis.  Mobilized the ileocecal mesentery off the right pelvis to get it out of the pelvis.  I worked to mobilize a little bit of the sigmoid colon in a lateral medial fashion.   I mobilized the rectosigmoid colon & elevated it to put the main pedicle on tension.  I scored the base of peritoneum of the medial side of the mesentery of the elevated left colon from the ligament of Treitz to the mid rectum.   I elevated the sigmoid mesentery and entered into the retro-mesenteric plane. We were able to identify the left ureter and gonadal vessels. We kept those posterior within the retroperitoneum and elevated the left colon mesentery off that. I did isolate the inferior mesenteric artery (IMA) pedicle but did not ligate it yet.  I continued distally and got into the avascular plane posterior to the mesorectum, sparing the nervi ergentes.. This allowed me to help mobilize the rectum as well by freeing the mesorectum off the sacrum.  I stayed away from the right and left ureters.  I kept the lateral vascular pedicles to the rectum intact.  I skeletonized the lymph nodes off the inferior mesenteric artery pedicle.  I went down to its takeoff from the aorta.  I isolated the inferior mesenteric vein off of the ligament of Treitz just cephalad to that as well.  After confirming the left ureter was out of the way, I went ahead and ligated the inferior mesenteric artery pedicle just near its takeoff from the aorta.  Later in the case I did have to ligate the inferior mesenteric vein in a similar fashion.  We ensured hemostasis.  I continued medial to lateral dissection to free the left colon mesentery off the retroperitoneum going up towards the splenic flexure to allow good mobility and protect the colon mesentery.  I mobilized the left  colon in a lateral to medial fashion off the retroperitoneum and sidewall attachments along the line of Toldt up towards the splenic flexure to ensure good mobilization of the remaining left colon to reach into the pelvis.   We then focused on pelvic dissection.  Freed the mesorectum off the presacral plane until I was distal to the concerning region.  Freed off  peritoneum on the lateral sidewalls as well and transected the mesentery of the lateral pedicles to get distal to the area of concern.  Ended up encountering left ovarian cyst that there is thin-walled.  The left adnexa was densely adherent to the colon signed up taking that en bloc.  Evaded in the left adnexa off the retroperitoneum and confirmed the left ureter was out of the way.  Ligated the ovarian vessels with the vessel sealer.  Further mobilized and freed the left neck attachments to the left lateral pelvis and kept adhesions to the rectosigmoid colon.  With that I could better see anteriorly.  Came around anteriorly where there clearly were dense adhesions to the vaginal cuff.   After confirming the ureter was out of the way carefully worked from right side and left side and came through the left vaginal cuff to find the colovaginal fistula as suspected clinically.  With that I continue anterior dissection.  She had somewhat low peritoneal reflection and there was a lot of inflammation of the rectosigmoid.  No obvious abscess though.  Did further posterior dissection.  Eventually had to go down to the mid rectum that was more soft and noninflamed and densely adherent.  Transected through the mesorectum more in the proximal rectum and slowly free the mesorectum off towards the mid rectum to have good blood supply until I found an area that was soft and rectal wall was more soft and intact and not irritated.  I chose a region at the descending/sigmoid junction that was soft and reached down to the pelvis.  Transected the colon mesentery radially to preserve good collateral and marginal artery blood supply.  To assess vascular perfusion of tissues, we asked anesthesia use intravenous  indocyanine green (ICG) with IV flush.  I switched to the NIR fluorescence (Firefly mode) imaging window on the daVinci robot platform.  We were able to see good light green visualization of blood vessels with good vascular  perfusion of tissues, confirming good tissue perfusion of tissues (descending/sigmoid junction and mid rectum) planned for anastomosis. Then transected at the distal margin with a robotic stapler -60 mm green load.  We created an extraction incision through a small Pfannenstiel incision in the suprapubic region.  Placed a wound protector.  I was able to eviscerate the rectosigmoid and descending colon out the wound.   I clamped the colon proximal to this area using a reusable pursestringer device.  Passed a 2-0 Keith needle. I transected at the descending/sigmoid junction with a scalpel. I got healthy bleeding mucosa.  We sent the rectosigmoid colon specimen off to go to pathology.  We sized the colon orifice.  I chose a 29mm EEA anvil stapler system.  I reinforced the prolene pursestring with interrupted silk "belt loop" sutures.  I placed the anvil to the open end of the proximal remaining colon and closed around it using the pursestring.  Turned it back into the abdomen.  Repeated insufflation and redocked the robot.  The left colon had contracted upward somewhat.  I did more mobilization and this is when I took the inferior mesenteric vein above  the ligament of Treitz.  Lysed up to the inferior pancreatic ridge and came around on the splenic flexure retroperitoneum towards the distal transverse colon for partial splenic flexure mobilization.  Patient had moderately dense adhesions of distal transverse colon and proximal descending colon to itself as well as omental adhesions.  Carefully came through that.  With that I had much better relaxation and the anvil easily rested in the lower pelvis without tension which was reassuring. We did copious irrigation with crystalloid solution.  Hemostasis was good.    Next I focused on closing the colovaginal fistula in the left vaginal cuff.  Confirming the bladder and ureter out of the way, I closed transversely using absorbable serrated 2-0 V-Loc suture in a 2  layer running fashion to good result.  Dr Maisie Fus scrubbed down and did gentle anal dilation and advanced the EEA stapler up the rectal stump.  Ended up having to aim on the anterior rectal wall.  The spike was brought out at the anterior rectal wall under direct visualization.  I attached the anvil of the proximal colon the spike of the stapler. Anvil was tightened down and held clamped for 60 seconds.  Orientation was confirmed such that there is no twisting of the colon nor small bowel underneath the mesenteric defect. No concerning tension.  The EEA stapler was fired and held clamped for 30 seconds. The stapler was released & removed. Blue stitch is in the proximal ring.  Care was taken to ensure no other structures were incorporated within this either.  We noted 2 excellent anastomotic rings. The colon proximal to the anastomosis was then gently occluded. The pelvis was filled with sterile irrigation.  Dr Maisie Fus  did flexible sigmoidoscopy and proctoscopy noted the anastomosis was at 7 cm from the anal verge consistent with the proximal rectum.  There was a negative air leak test. There was no tension of mesentery or bowel at the anastomosis.   Tissues looked viable.  Ureters & bowel uninjured.  The anastomosis looked healthy.   Endoluminal gas was evacuated.  Ports & wound protector removed.  We changed gloves & redraped the patient per colon SSI prevention protocol.  We aspirated the sterile irrigation.  Hemostasis was good.  Greater omentum positioned down into the pelvis to help protect the anastomosis.  Sterile unused instruments were used from this point.  I closed the skin at the port sites using Monocryl stitch and sterile dressing.  We assured hemostasis and the former ostomy wound.  Wound irrigated.  I closed the posterior rectus fascia with 0 Vicryl suture.  Anterior rectus fascia was closed using #1 PDS transversely.  Sterile dressing placed.   Patient is being extubated go to recovery room. I  had discussed postop care with the patient in detail the office & in the holding area. Instructions are written. I discussed operative findings, updated the patient's status, discussed probable steps to recovery, and gave postoperative recommendations to the patient's daughter, Fara Olden .  Recommendations were made.  Questions were answered.  She expressed understanding & appreciation.  Ardeth Sportsman, M.D., F.A.C.S. Gastrointestinal and Minimally Invasive Surgery [facility] [address] [place], [place] [address] [phone] Main / Paging

## 2023-10-23 NOTE — Transfer of Care (Signed)
Immediate Anesthesia Transfer of Care Note  Patient: Kylie Casey  Procedure(s) Performed: ROBOTIC RESECTION OF COLON RECTOSIGMOID , left oophorectomy RIGID PROCTOSCOPY CYSTOSCOPY with FIREFLY INJECTION  Patient Location: PACU  Anesthesia Type:General  Level of Consciousness: drowsy  Airway & Oxygen Therapy: Patient Spontanous Breathing and Patient connected to face mask oxygen  Post-op Assessment: Report given to RN and Post -op Vital signs reviewed and stable  Post vital signs: Reviewed and stable  Last Vitals:  Vitals Value Taken Time  BP 128/89 10/23/23 1048  Temp    Pulse 77 10/23/23 1050  Resp 16 10/23/23 1050  SpO2 100 % 10/23/23 1050  Vitals shown include unfiled device data.  Last Pain:  Vitals:   10/23/23 0629  TempSrc: Oral         Complications: No notable events documented.

## 2023-10-23 NOTE — Op Note (Signed)
Preoperative diagnosis:  Pelvic Abscess   Diverticulitis Postoperative diagnosis:  Same   Procedure: Cystoscopy Instillation of ureteral firefly constrast   Surgeon: Crist Fat, MD   Anesthesia: General   Complications: None   Intraoperative findings: cystitis but otherwise normal bladder anatomy with no evidence of fistula.   EBL: Minimal   Specimens: None   Indication:  Kylie Casey  is a 77 y.o.  patient with  diverticular abscess colovaginal fistula.  Dr. Michaell Cowing requested cystoscopy and instillation of firefly contrast to help facilitate the dissection of the sigmoid colon.  After reviewing the management options for treatment, he elected to proceed with the above surgical procedure(s). We have discussed the potential benefits and risks of the procedure, side effects of the proposed treatment, the likelihood of the patient achieving the goals of the procedure, and any potential problems that might occur during the procedure or recuperation. Informed consent has been obtained.   Description of procedure:   The patient was taken to the operating room and general anesthesia was induced.  The patient was placed in the dorsal lithotomy position, prepped and draped in the usual sterile fashion, and preoperative antibiotics were administered. A preoperative time-out was performed.    A 21 French 30 degree cystoscope was gently passed through the patient's urethra into the bladder.  The bladder was subsequently emptied and then filled slowly up performing a 360 degrees cystoscopic evaluation.  This demonstrated orthotopic ureteral orifices, normal bladder mucosa with an area of heaped mucosa and bullous edema in the dome -  evidence of colovesical fistula without mucosal abnormality.   I then advanced a 5 [place] open-ended ureteral catheter into the patient's left ureteral orifice and  I then advanced the catheter up into the proximal ureter and then slowly pulled back and injected  7.25ml of the firefly contrast.  Subsequently turned my attention to the patient's right ureteral orifice and performed a similar task.   I then  placed a 16 [place] Foley.    The surgery was then turned over to Dr. Michaell Cowing for facilitation of the remainder of the case.

## 2023-10-23 NOTE — H&P (Signed)
10/23/2023    REFERRING PHYSICIAN: Earnest Bailey, DO  Patient Care Team: Gilmore Laroche, NP as PCP - General Earnest Bailey, DO (Gastroenterology) Michaell Cowing, Shawn Route, MD as Consulting Provider (General Surgery) Letta Median as Physician Assistant (Gastroenterology)  PROVIDER: Jarrett Soho, MD  [facility] MRN: [id number] DOB: 1946-10-25  SUBJECTIVE   Chief Complaint: New Consultation   Kylie Casey is a 77 y.o. 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  History of Present Illness:  77 year old woman. She lives up in [place]. I think she tends to avoid doctors. Established care through [facility] health June 2024. Patient followed by [facility] in the past. Colonoscopy in 2017 by Dr. Madilyn Fireman who is since retired. 1 adenomatous polyp noted and left-sided diverticulosis noted. Patient felt like she was getting a urinary tract infection that would not go away. There is evidence of 1 in November 2022. Patient felt like she was having bowel movements coming out the front for the past year. Feels like she passes gas at her vagina as well. Patient had a CAT scan from 2022 showing diverticulosis in the colon without diverticulitis. Sigmoid colon adhered to the vaginal cuff from prior hysterectomy. Patient sent to gastroenterology. 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 CAT scan done in July suspicious for it as well. Patient had a colonoscopy referral. Dr. Marletta Lor did that last month. Noted diverticulosis but no mass or tumor. Surgical consultation requested.   Patient notes she moves her bowels usually once a day. She lives in a second floor apartment by herself. She has 1 daughter in [place] and 1 in [place]. She had hysterectomy in the 1970s but no other abdominal surgery. I think she has borderline diabetes been on any significant medication. She claims she can walk half hour and go up and down  stairs without much difficulty. No sleep apnea that she knows of. No history of heart attack or stroke. Does not smoke. No sleep apnea. She worked most of her life on second and third shift.  Ready for surgery    Medical History:  History reviewed. No pertinent past medical history.  Patient Active Problem List  Diagnosis  Colovaginal fistula  History of adenomatous polyp of colon   Past Surgical History:  Procedure Laterality Date  Boil on back removed  HYSTERECTOMY    No Known Allergies  No current outpatient medications on file prior to visit.   No current facility-administered medications on file prior to visit.   Family History  Problem Relation Age of Onset  Stroke Mother  High blood pressure (Hypertension) Father    Social History   Tobacco Use  Smoking Status Never  Smokeless Tobacco Never    Social History   Socioeconomic History  Marital status: Single  Tobacco Use  Smoking status: Never  Smokeless tobacco: Never  Substance and Sexual Activity  Alcohol use: Never  Drug use: Never   ############################################################  Review of Systems: A complete review of systems (ROS) was obtained from the patient.  We have reviewed this information and discussed as appropriate with the patient.  See HPI as well for other pertinent ROS.  Constitutional: No fevers, chills, sweats. Weight stable Eyes: No vision changes, No discharge HENT: No sore throats, nasal drainage Lymph: No neck swelling, No bruising easily Pulmonary: No cough, productive sputum CV: No orthopnea, PND . No exertional chest/neck/shoulder/arm pain. Patient can walk 30 minutes without difficulty.   GI: No personal  nor family history of GI/colon cancer, inflammatory bowel disease, irritable bowel syndrome, allergy such as Celiac Sprue, dietary/dairy problems, colitis, ulcers nor gastritis. No recent sick contacts/gastroenteritis. No travel outside the country. No  changes in diet.  Renal: No UTIs, No hematuria Genital: No drainage, bleeding, masses Musculoskeletal: No severe joint pain. Good ROM major joints Skin: No sores or lesions Heme/Lymph: No easy bleeding. No swollen lymph nodes Neuro: No active seizures. No facial droop Psych: No hallucinations. No agitation  OBJECTIVE   Vitals:  08/19/23 1032 08/19/23 1033  BP: 134/82  Pulse: 78  Temp: 36.7 C (98.1 F)  SpO2: 98%  Weight: 65 kg (143 lb 6.4 oz)  Height: 156.2 cm (5' 1.5")  PainSc: 0-No pain   Body mass index is 26.66 kg/m.  PHYSICAL EXAM:  Constitutional: Not cachectic. Hygeine adequate. Vitals signs as above.  Eyes: No glasses. Vision adequate,Pupils reactive, normal extraocular movements. Sclera nonicteric Neuro: CN II-XII intact. No major focal sensory defects. No major motor deficits. Lymph: No head/neck/groin lymphadenopathy Psych: No severe agitation. No severe anxiety. Judgment & insight Adequate, Oriented x4, HENT: Normocephalic, Mucus membranes moist. No thrush. Hearing: adequate Neck: Supple, No tracheal deviation. No obvious thyromegaly Chest: No pain to chest wall compression. Good respiratory excursion. No audible wheezing CV: Pulses intact. regular. No major extremity edema Ext: No obvious deformity or contracture. Edema: Not present. No cyanosis Skin: No major subcutaneous nodules. Warm and dry Musculoskeletal: Severe joint rigidity not present. No obvious clubbing. No digital petechiae. Mobility: no assist device moving easily without restrictions Abdomen: Flat Soft. Nondistended. Nontender. Hernia: Not present. Diastasis recti: Not present. No hepatomegaly. No splenomegaly.  Genital/Pelvic: Obvious stool in vaginal vault with small punched-out opening at the apex of vagina with cuff very suspicious for colovaginal fistula. I nguinal hernia: Not present. Inguinal lymph nodes: without lymphadenopathy nor hidradenitis.   Rectal: No pruritus ANA. No fecal  soiling. No external hemorrhoids. No fissure or fissure abscess or condyloma. Normal sphincter tone. Tolerates digital exam. Hard thick stool in vault with stretched out rectum. No major rectocele. No obvious masses felt to the tip my finger around 7 cm.    ###################################################################  Labs, Imaging and Diagnostic Testing:  Located in 'Care Everywhere' section of Epic EMR chart  PRIOR [facility] CLINIC NOTES:  Not applicable  SURGERY NOTES:  Not applicable  PATHOLOGY:  Located in 'Care Everywhere' section of Epic EMR chart  Assessment and Plan:  DIAGNOSES:  Diagnoses and all orders for this visit:  Colovaginal fistula  History of adenomatous polyp of colon    ASSESSMENT/PLAN  Active woman who tends to avoid doctors with vaginal drainage and no evidence of malignancy consistent with a colovaginal fistula most likely due to diverticular disease.  Standard of care is segmental colonic resection with takedown and repair of colovaginal fistula. She is to be a candidate for a minimally invasive robotic approach. Probably do firefly at the start the case to make sure the ureters are not involved. Hopefully likelihood of colostomy is low, but given her advanced age we will get preoperative marking just in case. The anatomy & physiology of the digestive tract was discussed to the patient and her daughter Mickeal Needy. The pathophysiology of colorectal fistula to the vagina was discussed. Natural history risks without surgery was discussed. I worked to give an overview of the disease and the frequent need to have multispecialty involvement.   I feel the risks of no intervention will lead to serious problems that outweigh the operative risks; therefore,  I recommended surgery to treat the pathology. Minimally Invasive (robotic/laparoscopic) & open techniques for probable colectomy were discussed. Repair of the vaginal opening with possible need for intervening  material to prevent recurrence such as omentum, muscle, mesh, etc was discussed. Possible fecal diversion by ostomy was discussed. We will work to preserve anal & pelvic floor function without sacrificing cure.  Risks such as bleeding, infection, abscess, leak, injury to other organs, need for repair of tissues / organs, recurrence with reoperation, possible ostomy, hernia, heart attack, death, and other risks were discussed. I noted a good likelihood this will help address the problem. Goals of post-operative recovery were discussed as well. We will work to minimize complications. Educational information was given as well. Questions were answered.   The patient and her daughter express understanding & wish to proceed with surgery.  Patient is rather active with no prior cardiac or pulmonary issues and does not appear to be right wrist. No major concerns from primary care.  Ardeth Sportsman, MD, FACS, MASCRS Esophageal, Gastrointestinal & Colorectal Surgery Robotic and Minimally Invasive Surgery  [facility] [address], [address] [place], [place] [address] [phone] Fax [phone] Main  CONTACT INFORMATION: Weekday (9AM-5PM): Call [facility] main office at [phone] Weeknight (5PM-9AM) or Weekend/Holiday: Check EPIC "Web Links" tab & use "AMION" (password " [email or web address]") for General Surgery [facility] coverage  Please, DO NOT use SecureChat  (it is not reliable communication to reach operating surgeons & will lead to a delay in care).   Epic staff messaging available for outptient concerns needing 1-2 business day response.      10/23/2023

## 2023-10-23 NOTE — Progress Notes (Signed)
Alerted by RR nurse that pt was transferred to SDU  Nurse reports staff got pt up to bedside and pt had a syncopal episode. Was momentarily difficult to rouse, had 1 chest compression which awoke pt.  BP 106/61 (BP Location: Right Arm)   Pulse 80   Temp 98.2 F (36.8 C)   Resp 17   Ht 5\' 1"  (1.549 m)   Wt 63.7 kg   SpO2 99%   BMI 26.53 kg/m   Pt bp 102/50 HR 80  Nurse reports pt a&ox3 IVF at 100cc/hr  Will check stat cbc and bmet to make sure no lab derangement as source of syncopal episode  Mary Sella. Andrey Campanile, MD, FACS General, Bariatric, & Minimally Invasive Surgery [facility],  [facility]

## 2023-10-23 NOTE — Consult Note (Signed)
WOC consulted for preoperative marking.  Marking performed on 10/10/2023 by WOC RN.    WOC team will follow postop for ostomy needs.   Thank you,    Priscella Mann MSN, RN-BC, [employer] [phone]

## 2023-10-23 NOTE — Anesthesia Procedure Notes (Signed)
Procedure Name: Intubation Date/Time: 10/23/2023 7:45 AM  Performed by: Nathen May, CRNAPre-anesthesia Checklist: Patient identified, Emergency Drugs available, Suction available and Patient being monitored Patient Re-evaluated:Patient Re-evaluated prior to induction Oxygen Delivery Method: Circle System Utilized Preoxygenation: Pre-oxygenation with 100% oxygen Induction Type: IV induction Ventilation: Mask ventilation without difficulty Laryngoscope Size: Mac and 3 Grade View: Grade II Tube type: Oral Tube size: 7.0 mm Number of attempts: 1 Airway Equipment and Method: Stylet and Oral airway Placement Confirmation: ETT inserted through vocal cords under direct vision, positive ETCO2 and breath sounds checked- equal and bilateral Tube secured with: Tape Dental Injury: Teeth and Oropharynx as per pre-operative assessment

## 2023-10-23 NOTE — Discharge Instructions (Signed)
SURGERY: POST OP INSTRUCTIONS (Surgery for small bowel obstruction, colon resection, etc)   ######################################################################  EAT Gradually transition to a high fiber diet with a fiber supplement over the next few days after discharge  WALK Walk an hour a day.  Control your pain to do that.    CONTROL PAIN Control pain so that you can walk, sleep, tolerate sneezing/coughing, go up/down stairs.  HAVE A BOWEL MOVEMENT DAILY Keep your bowels regular to avoid problems.  OK to try a laxative to override constipation.  OK to use an antidairrheal to slow down diarrhea.  Call if not better after 2 tries  CALL IF YOU HAVE PROBLEMS/CONCERNS Call if you are still struggling despite following these instructions. Call if you have concerns not answered by these instructions  ######################################################################   DIET Follow a light diet the first few days at home.  Start with a bland diet such as soups, liquids, starchy foods, low fat foods, etc.  If you feel full, bloated, or constipated, stay on a ful liquid or pureed/blenderized diet for a few days until you feel better and no longer constipated. Be sure to drink plenty of fluids every day to avoid getting dehydrated (feeling dizzy, not urinating, etc.). Gradually add a fiber supplement to your diet over the next week.  Gradually get back to a regular solid diet.  Avoid fast food or heavy meals the first week as you are more likely to get nauseated. It is expected for your digestive tract to need a few months to get back to normal.  It is common for your bowel movements and stools to be irregular.  You will have occasional bloating and cramping that should eventually fade away.  Until you are eating solid food normally, off all pain medications, and back to regular activities; your bowels will not be normal. Focus on eating a low-fat, high fiber diet the rest of your life  (See Getting to Good Bowel Health, below).  CARE of your INCISION or WOUND  It is good for closed incisions and even open wounds to be washed every day.  Shower every day.  Short baths are fine.  Wash the incisions and wounds clean with soap & water.    You may leave closed incisions open to air if it is dry.   You may cover the incision with clean gauze & replace it after your daily shower for comfort.  TEGADERM:  You have clear gauze band-aid dressings over your closed incision(s).  Remove the dressings 3 days after surgery.    If you have an open wound with a wound vac, see wound vac care instructions.    ACTIVITIES as tolerated Start light daily activities --- self-care, walking, climbing stairs-- beginning the day after surgery.  Gradually increase activities as tolerated.  Control your pain to be active.  Stop when you are tired.  Ideally, walk several times a day, eventually an hour a day.   Most people are back to most day-to-day activities in a few weeks.  It takes 4-8 weeks to get back to unrestricted, intense activity. If you can walk 30 minutes without difficulty, it is safe to try more intense activity such as jogging, treadmill, bicycling, low-impact aerobics, swimming, etc. Save the most intensive and strenuous activity for last (Usually 4-8 weeks after surgery) such as sit-ups, heavy lifting, contact sports, etc.  Refrain from any intense heavy lifting or straining until you are off narcotics for pain control.  You will have off days, but  things should improve week-by-week. DO NOT PUSH THROUGH PAIN.  Let pain be your guide: If it hurts to do something, don't do it.  Pain is your body warning you to avoid that activity for another week until the pain goes down. You may drive when you are no longer taking narcotic prescription pain medication, you can comfortably wear a seatbelt, and you can safely make sudden turns/stops to protect yourself without hesitating due to pain. You may  have sexual intercourse when it is comfortable. If it hurts to do something, stop.   MEDICATIONS Take your usually prescribed home medications unless otherwise directed.   Blood thinners:  You can restart any strong blood thinners after the second postoperative day.  It is OK to continue aspirin before & after surgery..    Some blood in BMs the first 1-2 weeks is common but should taper down & be small volume.  If you are passing many large clots, call your surgeon    PAIN CONTROL Pain after surgery or related to activity is often due to strain/injury to muscle, tendon, nerves and/or incisions.  This pain is usually short-term and will improve in a few months.  To help speed the process of healing and to get back to regular activity more quickly, DO THE FOLLOWING THINGS TOGETHER: Increase activity gradually.  DO NOT PUSH THROUGH PAIN Use Ice and/or Heat Try Gentle Massage and/or Stretching Take over the counter pain medication Take Narcotic prescription pain medication for more severe pain  Good pain control = faster recovery.  It is better to take more medicine to be more active than to stay in bed all day to avoid medications.  Increase activity gradually Avoid heavy lifting at first, then increase to lifting as tolerated over the next 6 weeks. Do not "push through" the pain.  Listen to your body and avoid positions and maneuvers than reproduce the pain.  Wait a few days before trying something more intense Walking an hour a day is encouraged to help your body recover faster and more safely.  Start slowly and stop when getting sore.  If you can walk 30 minutes without stopping or pain, you can try more intense activity (running, jogging, aerobics, cycling, swimming, treadmill, sex, sports, weightlifting, etc.) Remember: If it hurts to do it, then don't do it! Use Ice and/or Heat You will have swelling and bruising around the incisions.  This will take several weeks to resolve. Ice packs  or heating pads (6-8 times a day, 30-60 minutes at a time) will help sooth soreness & bruising. Some people prefer to use ice alone, heat alone, or alternate between ice & heat.  Experiment and see what works best for you.  Consider trying ice for the first few days to help decrease swelling and bruising; then, switch to heat to help relax sore spots and speed recovery. Shower every day.  Short baths are fine.  It feels good!  Keep the incisions and wounds clean with soap & water.   Try Gentle Massage and/or Stretching Massage at the area of pain many times a day Stop if you feel pain - do not overdo it Take over the counter pain medication This helps the muscle and nerve tissues become less irritable and calm down faster Choose ONE of the following over-the-counter anti-inflammatory medications: Acetaminophen 500mg  tabs (Tylenol) 1-2 pills with every meal and just before bedtime (avoid if you have liver problems or if you have acetaminophen in you narcotic prescription) Naproxen 220mg  tabs (  ex. Aleve, Naprosyn) 1-2 pills twice a day (avoid if you have kidney, stomach, IBD, or bleeding problems) Ibuprofen 200mg  tabs (ex. Advil, Motrin) 3-4 pills with every meal and just before bedtime (avoid if you have kidney, stomach, IBD, or bleeding problems) Take with food/snack several times a day as directed for at least 2 weeks to help keep pain / soreness down & more manageable. Take Narcotic prescription pain medication for more severe pain A prescription for strong pain control is often given to you upon discharge (for example: oxycodone/Percocet, hydrocodone/Norco/Vicodin, or tramadol/Ultram) Take your pain medication as prescribed. Be mindful that most narcotic prescriptions contain Tylenol (acetaminophen) as well - avoid taking too much Tylenol. If you are having problems/concerns with the prescription medicine (does not control pain, nausea, vomiting, rash, itching, etc.), please call us [phone]  [phone] to see if we need to switch you to a different pain medicine that will work better for you and/or control your side effects better. If you need a refill on your pain medication, you must call the office before 4 pm and on weekdays only.  By federal law, prescriptions for narcotics cannot be called into a pharmacy.  They must be filled out on paper & picked up from our office by the patient or authorized caretaker.  Prescriptions cannot be filled after 4 pm nor on weekends.    WHEN TO CALL us [phone] Severe uncontrolled or worsening pain  Fever over 101 F (38.5 C) Concerns with the incision: Worsening pain, redness, rash/hives, swelling, bleeding, or drainage Reactions / problems with new medications (itching, rash, hives, nausea, etc.) Nausea and/or vomiting Difficulty urinating Difficulty breathing Worsening fatigue, dizziness, lightheadedness, blurred vision Other concerns If you are not getting better after two weeks or are noticing you are getting worse, contact our office [phone] for further advice.  We may need to adjust your medications, re-evaluate you in the office, send you to the emergency room, or see what other things we can do to help. The clinic staff is available to answer your questions during regular business hours (8:30am-5pm).  Please don't hesitate to call and ask to speak to one of our nurses for clinical concerns.    A surgeon from [facility] is always on call at the hospitals 24 hours/day If you have a medical emergency, go to the nearest emergency room or call 911.  FOLLOW UP in our office One the day of your discharge from the hospital (or the next business weekday), please call [facility] to set up or confirm an appointment to see your surgeon in the office for a follow-up appointment.  Usually it is 2-3 weeks after your surgery.   If you have skin staples at your incision(s), let the office know so we can set up a time  in the office for the nurse to remove them (usually around 10 days after surgery). Make sure that you call for appointments the day of discharge (or the next business weekday) from the hospital to ensure a convenient appointment time. IF YOU HAVE DISABILITY OR FAMILY LEAVE FORMS, BRING THEM TO THE OFFICE FOR PROCESSING.  DO NOT GIVE THEM TO YOUR DOCTOR.  [facility] [address], [place], [place]  [address] ? [phone] - Main [phone] - Toll Free,  [phone] - Fax [email or web address]    GETTING TO GOOD BOWEL HEALTH. It is expected for your digestive tract to need a few months to get back to  normal.  It is common for your bowel movements and stools to be irregular.  You will have occasional bloating and cramping that should eventually fade away.  Until you are eating solid food normally, off all pain medications, and back to regular activities; your bowels will not be normal.   Avoiding constipation The goal: ONE SOFT BOWEL MOVEMENT A DAY!    Drink plenty of fluids.  Choose water first. TAKE A FIBER SUPPLEMENT EVERY DAY THE REST OF YOUR LIFE During your first week back home, gradually add back a fiber supplement every day Experiment which form you can tolerate.   There are many forms such as powders, tablets, wafers, gummies, etc Psyllium bran (Metamucil), methylcellulose (Citrucel), Miralax or Glycolax, Benefiber, Flax Seed.  Adjust the dose week-by-week (1/2 dose/day to 6 doses a day) until you are moving your bowels 1-2 times a day.  Cut back the dose or try a different fiber product if it is giving you problems such as diarrhea or bloating. Sometimes a laxative is needed to help jump-start bowels if constipated until the fiber supplement can help regulate your bowels.  If you are tolerating eating & you are farting, it is okay to try a gentle laxative such as double dose MiraLax, prune juice, or Milk of Magnesia.  Avoid using  laxatives too often. Stool softeners can sometimes help counteract the constipating effects of narcotic pain medicines.  It can also cause diarrhea, so avoid using for too long. If you are still constipated despite taking fiber daily, eating solids, and a few doses of laxatives, call our office. Controlling diarrhea Try drinking liquids and eating bland foods for a few days to avoid stressing your intestines further. Avoid dairy products (especially milk & ice cream) for a short time.  The intestines often can lose the ability to digest lactose when stressed. Avoid foods that cause gassiness or bloating.  Typical foods include beans and other legumes, cabbage, broccoli, and dairy foods.  Avoid greasy, spicy, fast foods.  Every person has some sensitivity to other foods, so listen to your body and avoid those foods that trigger problems for you. Probiotics (such as active yogurt, Align, etc) may help repopulate the intestines and colon with normal bacteria and calm down a sensitive digestive tract Adding a fiber supplement gradually can help thicken stools by absorbing excess fluid and retrain the intestines to act more normally.  Slowly increase the dose over a few weeks.  Too much fiber too soon can backfire and cause cramping & bloating. It is okay to try and slow down diarrhea with a few doses of antidiarrheal medicines.   Bismuth subsalicylate (ex. Kayopectate, Pepto Bismol) for a few doses can help control diarrhea.  Avoid if pregnant.   Loperamide (Imodium) can slow down diarrhea.  Start with one tablet (2mg ) first.  Avoid if you are having fevers or severe pain.  ILEOSTOMY PATIENTS WILL HAVE CHRONIC DIARRHEA since their colon is not in use.    Drink plenty of liquids.  You will need to drink even more glasses of water/liquid a day to avoid getting dehydrated. Record output from your ileostomy.  Expect to empty the bag every 3-4 hours at first.  Most people with a permanent ileostomy empty their  bag 4-6 times at the least.   Use antidiarrheal medicine (especially Imodium) several times a day to avoid getting dehydrated.  Start with a dose at bedtime & breakfast.  Adjust up or down as needed.  Increase antidiarrheal medications as  directed to avoid emptying the bag more than 8 times a day (every 3 hours). Work with your wound ostomy nurse to learn care for your ostomy.  See ostomy care instructions. TROUBLESHOOTING IRREGULAR BOWELS 1) Start with a soft & bland diet. No spicy, greasy, or fried foods.  2) Avoid gluten/wheat or dairy products from diet to see if symptoms improve. 3) Miralax 17gm or flax seed mixed in 8oz. water or juice-daily. May use 2-4 times a day as needed. 4) Gas-X, Phazyme, etc. as needed for gas & bloating.  5) Prilosec (omeprazole) over-the-counter as needed 6)  Consider probiotics (Align, Activa, etc) to help calm the bowels down  Call your doctor if you are getting worse or not getting better.  Sometimes further testing (cultures, endoscopy, X-ray studies, CT scans, bloodwork, etc.) may be needed to help diagnose and treat the cause of the diarrhea. [facility] [address], [place], [place]  [address] [phone] - Main.    [phone]  - Toll Free.   [phone] - Fax [email or web address]    Pelvic floor muscle training exercises ("Kegels") can help strengthen the muscles under the uterus, bladder, and bowel (large intestine). They can help both men and women who have problems with urine leakage or bowel control.  A pelvic floor muscle training exercise is like pretending that you have to urinate, and then holding it. You relax and tighten the muscles that control urine flow. It's important to find the right muscles to tighten.  The next time you have to urinate, start to go and then stop. Feel the muscles in your vagina, bladder, or anus get tight and move up. These are the pelvic floor muscles. If you  feel them tighten, you've done the exercise right. If you are still not sure whether you are tightening the right muscles, keep in mind that all of the muscles of the pelvic floor relax and contract at the same time. Because these muscles control the bladder, rectum, and vagina, the following tips may help: Women: Insert a finger into your vagina. Tighten the muscles as if you are holding in your urine, then let go. You should feel the muscles tighten and move up and down.  Men: Insert a finger into your rectum. Tighten the muscles as if you are holding in your urine, then let go. You should feel the muscles tighten and move up and down. These are the same muscles you would tighten if you were trying to prevent yourself from passing gas.  It is very important that you keep the following muscles relaxed while doing pelvic floor muscle training exercises: Abdominal  Buttocks (the deeper, anal sphincter muscle should contract)  Thigh   A woman can also strengthen these muscles by using a vaginal cone, which is a weighted device that is inserted into the vagina. Then you try to tighten the pelvic floor muscles to hold the device in place. If you are unsure whether you are doing the pelvic floor muscle training correctly, you can use biofeedback and electrical stimulation to help find the correct muscle group to work. Biofeedback is a method of positive reinforcement. Electrodes are placed on the abdomen and along the anal area. Some therapists place a sensor in the vagina in women or anus in men to monitor the contraction of pelvic floor muscles.  A monitor will display a graph showing which muscles are contracting and which are at rest. The therapist can help find the right muscles  for performing pelvic floor muscle training exercises.   PERFORMING PELVIC FLOOR EXERCISES: 1. Begin by emptying your bladder. 2. Tighten the pelvic floor muscles and hold for a count of 10. 3. Relax the muscles completely  for a count of 10. 4. Do 10 repititions, 3 to 5 times a day (morning, afternoon, and night). You can do these exercises at any time and any place. Most people prefer to do the exercises while lying down or sitting in a chair. After 4 - 6 weeks, most people notice some improvement. It may take as long as 3 months to see a major change. After a couple of weeks, you can also try doing a single pelvic floor contraction at times when you are likely to leak (for example, while getting out of a chair). A word of caution: Some people feel that they can speed up the progress by increasing the number of repetitions and the frequency of exercises. However, over-exercising can instead cause muscle fatigue and increase urine leakage. If you feel any discomfort in your abdomen or back while doing these exercises, you are probably doing them wrong. Breathe deeply and relax your body when you are doing these exercises. Make sure you are not tightening your stomach, thigh, buttock, or chest muscles. When done the right way, pelvic floor muscle exercises have been shown to be very effective at improving urinary continence.  Pelvic Floor Pain / Incontinence  Do you suffer from pelvic pain or incontinence? Do you have pain in the pelvis, low back or hips that is associated with sitting, walking, urination or intercourse? Have you experienced leaking of urine or feces when coughing, sneezing or laughing? Do you have pain in the pelvic area associated with cancer?  These are conditions that are common with pelvic floor muscle dysfunction. Over time, due to stress, scar tissue, surgeries and the natural course of aging, our muscles may become weak or overstressed and can spasm. This can lead to pain, weakness, incontinence or decreased quality of life.  Men and women with pelvic floor dysfunction frequently describe:  A "falling out" feeling. Pain or burning in the abdomen, tailbone or perineal area. Constipation or  bowel elimination problems or difficulty initiating urination. Unresolved low back or hip pain. Frequency and urgency when going to the bathroom. Leaking of urine or feces. Pain with intercourse.  [email or web address]  To make a referral or for more information about [facility], call  [facility]) - [phone] Sidney Ace [phone] [place]) - [phone] [facility]) - [phone]

## 2023-10-23 NOTE — Anesthesia Preprocedure Evaluation (Signed)
Anesthesia Evaluation  Patient identified by MRN, date of birth, ID band Patient awake    Reviewed: Allergy & Precautions, H&P , NPO status , Patient's Chart, lab work & pertinent test results  Airway Mallampati: II  TM Distance: >3 FB Neck ROM: Full    Dental no notable dental hx.    Pulmonary neg pulmonary ROS   Pulmonary exam normal breath sounds clear to auscultation       Cardiovascular negative cardio ROS Normal cardiovascular exam Rhythm:Regular Rate:Normal     Neuro/Psych negative neurological ROS  negative psych ROS   GI/Hepatic negative GI ROS, Neg liver ROS,,,  Endo/Other  negative endocrine ROS    Renal/GU negative Renal ROS  negative genitourinary   Musculoskeletal negative musculoskeletal ROS (+)    Abdominal   Peds negative pediatric ROS (+)  Hematology negative hematology ROS (+)   Anesthesia Other Findings   Reproductive/Obstetrics negative OB ROS                             Anesthesia Physical Anesthesia Plan  ASA: 2  Anesthesia Plan: General   Post-op Pain Management: Tylenol PO (pre-op)* and Gabapentin PO (pre-op)*   Induction: Intravenous  PONV Risk Score and Plan: 3 and Ondansetron, Dexamethasone and Treatment may vary due to age or medical condition  Airway Management Planned: Oral ETT  Additional Equipment:   Intra-op Plan:   Post-operative Plan: Extubation in OR  Informed Consent: I have reviewed the patients History and Physical, chart, labs and discussed the procedure including the risks, benefits and alternatives for the proposed anesthesia with the patient or authorized representative who has indicated his/her understanding and acceptance.     Dental advisory given  Plan Discussed with: CRNA and Surgeon  Anesthesia Plan Comments:        Anesthesia Quick Evaluation

## 2023-10-23 NOTE — Significant Event (Signed)
Rapid Response Event Note   Reason for Call : Syncopal episode    Initial Focused Assessment:   Patient lethargic, does was up to tactfile stimuli. Alert to self     Interventions:  Transfer to Stepdown.  Monitor closely    Plan of Care: Stat Labs, notify MD if abnormal  Notify MD if becomes Hypotensive    MD Notified:  Dr Andrey Campanile  Call Time: 1900 Arrival Time: 1900 End Time: 1945  Sharyn Lull Galan Ghee, RN

## 2023-10-23 NOTE — Progress Notes (Signed)
Pt called out requesting to be cleaned after BM. This nurse, tech, and pt's daughter stood pt at side of bed. Pt became diaphoretic and unresponsive. Laid pt on bed and performed 3 sternal rubs without response. No carotid pulses felt. Pupils fixed and dilated. RR called. Immediately began chest compressions. Performed one chest compression with immediate arousal. Pt responses slowed. Diaphoretic. RR on scene and transferred pt to ICU.

## 2023-10-24 ENCOUNTER — Encounter (HOSPITAL_COMMUNITY): Payer: Self-pay | Admitting: Surgery

## 2023-10-24 DIAGNOSIS — R55 Syncope and collapse: Secondary | ICD-10-CM | POA: Insufficient documentation

## 2023-10-24 DIAGNOSIS — N824 Other female intestinal-genital tract fistulae: Secondary | ICD-10-CM | POA: Diagnosis not present

## 2023-10-24 DIAGNOSIS — E559 Vitamin D deficiency, unspecified: Secondary | ICD-10-CM

## 2023-10-24 LAB — BASIC METABOLIC PANEL
Anion gap: 7 (ref 5–15)
BUN: 8 mg/dL (ref 8–23)
CO2: 24 mmol/L (ref 22–32)
Calcium: 8.4 mg/dL — ABNORMAL LOW (ref 8.9–10.3)
Chloride: 107 mmol/L (ref 98–111)
Creatinine, Ser: 0.86 mg/dL (ref 0.44–1.00)
GFR, Estimated: >60 mL/min (ref >60)
Glucose, Bld: 141 mg/dL — ABNORMAL HIGH (ref 70–99)
Potassium: 4 mmol/L (ref 3.5–5.1)
Sodium: 138 mmol/L (ref 135–145)

## 2023-10-24 LAB — CBC
HCT: 30 % — ABNORMAL LOW (ref 36.0–46.0)
Hemoglobin: 10.6 g/dL — ABNORMAL LOW (ref 12.0–15.0)
MCH: 31 pg (ref 26.0–34.0)
MCHC: 35.3 g/dL (ref 30.0–36.0)
MCV: 87.7 fL (ref 80.0–100.0)
Platelets: 163 10*3/uL (ref 150–400)
RBC: 3.42 MIL/uL — ABNORMAL LOW (ref 3.87–5.11)
RDW: 13.2 % (ref 11.5–15.5)
WBC: 8.2 10*3/uL (ref 4.0–10.5)
nRBC: 0 % (ref 0.0–0.2)

## 2023-10-24 LAB — MAGNESIUM: Magnesium: 2 mg/dL (ref 1.7–2.4)

## 2023-10-24 MED ORDER — LACTATED RINGERS IV SOLN: Freq: Three times a day (TID) | INTRAVENOUS | Status: DC | PRN

## 2023-10-24 MED ORDER — SODIUM CHLORIDE 0.9 % IV SOLN
100.0000 mg | Freq: Once | INTRAVENOUS | Status: AC
  Administered 2023-10-24: 100 mg via INTRAVENOUS
  Filled 2023-10-24: qty 5

## 2023-10-24 MED ORDER — SODIUM CHLORIDE 0.9 % IV SOLN: INTRAVENOUS | Status: AC

## 2023-10-24 NOTE — TOC Initial Note (Addendum)
Transition of Care [facility]) - Initial/Assessment Note    Patient Details  Name: Kylie Casey MRN: [id number] Date of Birth: [birth year]  Transition of Care [facility]) CM/SW Contact:    Lavenia Atlas, RN Phone Number: 10/24/2023, 4:33 PM  Clinical Narrative:   Per chart review patient currently in [facility] SDU. This RNCM spoke with patient who reports prior to admission she does not have any DME or previous HH services. PT eval orders on file. Patient reports being safe at home and has no current questions or concerns.   Transportation at discharge: family                TOC following for any discharge needs.   Expected Discharge Plan: Home/Self Care Barriers to Discharge: Continued Medical Work up   Patient Goals and CMS Choice Patient states their goals for this hospitalization and ongoing recovery are:: return home feeling better CMS Medicare.gov Compare Post Acute Care list provided to:: Patient Choice offered to / list presented to : Patient [place] ownership interest in [place].provided to:: Patient    Expected Discharge Plan and Services In-house Referral: NA Discharge Planning Services: CM Consult Post Acute Care Choice: NA Living arrangements for the past 2 months: Single Family Home                 DME Arranged: N/A DME Agency: NA       HH Arranged: NA HH Agency: NA        Prior Living Arrangements/Services Living arrangements for the past 2 months: Single Family Home Lives with:: Self Patient language and need for interpreter reviewed:: Yes Do you feel safe going back to the place where you live?: Yes      Need for Family Participation in Patient Care: No (Comment) Care giver support system in place?: Yes (comment) Current home services: Other (comment) (None) Criminal Activity/Legal Involvement Pertinent to Current Situation/Hospitalization: No - Comment as needed  Activities of Daily Living   ADL Screening (condition at time of  admission) Independently performs ADLs?: Yes (appropriate for developmental age) Is the patient deaf or have difficulty hearing?: No Does the patient have difficulty seeing, even when wearing glasses/contacts?: No Does the patient have difficulty concentrating, remembering, or making decisions?: No  Permission Sought/Granted Permission sought to share information with : Case Manager Permission granted to share information with : Yes, Verbal Permission Granted  Share Information with NAME: Case Manager           Emotional Assessment Appearance:: Appears stated age Attitude/Demeanor/Rapport: Gracious Affect (typically observed): Accepting Orientation: : Oriented to Self, Oriented to Place, Oriented to  Time, Oriented to Situation Alcohol / Substance Use: Not Applicable Psych Involvement: No (comment)  Admission diagnosis:  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 [place] [address] Phone: [phone] Fax: [phone]     Social Determinants of Health (SDOH) Social History: SDOH Screenings   Food Insecurity: Patient Declined (10/23/2023)  Housing: Patient Declined (10/23/2023)  Transportation Needs: Patient Declined (10/23/2023)  Utilities: Patient Declined (10/23/2023)  Alcohol Screen:  Low Risk  (10/15/2023)  Depression (PHQ2-9): Low Risk  (10/15/2023)  Recent Concern: Depression (PHQ2-9) - Medium Risk (09/02/2023)  Financial Resource Strain: Low Risk  (10/15/2023)  Physical Activity: Insufficiently Active (10/15/2023)  Social Connections: Socially Isolated  (10/15/2023)  Stress: No Stress Concern Present (10/15/2023)  Tobacco Use: Low Risk  (10/23/2023)  Health Literacy: Adequate Health Literacy (10/15/2023)   SDOH Interventions:     Readmission Risk Interventions    10/24/2023    4:29 PM  Readmission Risk Prevention Plan  Post Dischage Appt Complete  Medication Screening Complete  Transportation Screening Complete

## 2023-10-24 NOTE — Progress Notes (Signed)
Sitting up in bed smiling and joking. Stop blood pressure 110s.  Nontachycardic. Tolerated full liquids with no nausea or vomiting.  They are hoping to get her out of bed later today. Right IV iron and follow hemoglobin.  Low threshold to hold enoxaparin if drops. She remained appreciative of care.

## 2023-10-24 NOTE — Progress Notes (Signed)
10/24/2023  Kylie Casey July [id number] 02/23/1946  CARE TEAM: PCP: Gilmore Laroche, FNP  Outpatient Care Team: Patient Care Team: Gilmore Laroche, FNP as PCP - General (Family Medicine) Karie Soda, MD as Consulting Physician (General Surgery) Lanelle Bal, DO as Consulting Physician (Gastroenterology)  Inpatient Treatment Team: Treatment Team:  Karie Soda, MD Inez Catalina, RN May, Sharyn Lull, RN [place], Jacqulyn Cane, [facility] [name], MD [name], MD [name], RN   Problem List:   Principal Problem:   Colovaginal fistula Active Problems:   Prediabetes   History of adenomatous polyp of colon   Syncope   Vitamin D deficiency   10/23/2023  POST-OPERATIVE DIAGNOSIS:   COLOVAGINAL FISTULA OVARIAN CYST - LEFT   PROCEDURE:   ROBOTIC LOW ANTERIOR RECTOSIGMOID RESECTION TAKEDOWN & REPAIR OF COLOVAGINAL FISTULA EN-BLOC LEFT SALPINGO-OOPHORECTOMY INTRAOPERATIVE ASSESSMENT OF TISSUE VASCULAR PERFUSION USING ICG (indocyanine green) IMMUNOFLUORESCENCE TRANSVERSUS ABDOMINIS PLANE (TAP) BLOCK - BILATERAL FLEXIBLE SIGMOIDOSCOPY   SURGEON:  Ardeth Sportsman, MD  OR FINDINGS:   Patient had thickened inflamed colon with dense adhesions of rectosigmoid to left lateral vaginal cuff and left adnexa with enlarged but simple ovarian cyst.  En bloc left salpingo-oophorectomy done.  Resection needed to go down to mid rectum for good margins.  Primary repair of colovaginal fistula done   No obvious metastatic disease on visceral parietal peritoneum or liver.   It is a 29mm EEA anastomosis ( distal descending colon  connected to mid rectum.)  It rests 7 cm from the anal verge by rigid proctoscopy.    Assessment [facility] Stay = 1 days) 1 Day Post-Op    Syncopal event yesterday but feels fine now    Plan:  -ERAS pathway  -Advance diet.  -Syncopal event last night concerning but there is no evidence of any persistent hypotension.  Was able to get  input from the floor nurse and the stepdown nurses.  No focal deficits concerning for stroke.  Telemetry with no events or atrial fibrillation.  She is bright alert and fussing/teasing me like she usually does.  Hemoglobin not severely decreased and blood pressure okay.  Suspect she had a vasovagal event.  I have asked [facility] hospitalist internal medicine's for consultation to keep me honest Dr. Kirby Crigler to help see the patient.  Keep on telemetry for now.  I ordered an EKG just in case.  Hold IV fluids per protocol to avoid ileus unless patient orthostatic.  DC Foley postop day 1 per protocol.  Follow-up in pathology.  Most likely diverticulitis as etiology for colovaginal fistula.  Serial labs follow electrolytes and hemoglobin.  -VTE prophylaxis- SCDs, etc  -mobilize as tolerated to help recovery.  Check orthostatics.  -Disposition:  Disposition:  The patient is from: Home Anticipate discharge to:  Home Anticipated Date of Discharge is:  November 10,2024   Barriers to discharge:  Consultant clearance & sign off  , Testing result pending, Need for inpatient procedure/study, and Pending Clinical improvement (more likely than not)  Patient currently is NOT MEDICALLY STABLE for discharge from the hospital from a surgery standpoint.      I reviewed nursing notes, last 24 h vitals and pain scores, last 48 h intake and output, last 24 h labs and trends, and last 24 h imaging results.  I have reviewed this patient's available data, including medical history, events of note, test results, etc as part of my evaluation.   A significant portion of that time was spent in counseling. Care during the  described time interval was provided by me.  This care required moderate level of medical decision making.  10/24/2023    Subjective: (Chief complaint)  Patient passed out while standing last night.  Initially responded but woke up to sternal rub.  Rapid response team came and took the  patient to stepdown unit.  No events in the stepdown unit.  No hypotension.  Labs okay.  No rhythm or dysrhythmia changes on telemetry.  Patient bright and alert.  Fussing at me.  Reminding me to get her a cloth skullcap like I have.  Denies any pain or discomfort.  Tolerating liquids.  No chest pain.  No lightheadedness or weakness.  No hematemesis or nausea vomiting or diarrhea.  Objective:  Vital signs:  Vitals:   10/24/23 0600 10/24/23 0650 10/24/23 0700 10/24/23 0800  BP: 106/62  116/71 109/64  Pulse: 79  96 74  Resp: 12  (!) 23 11  Temp:  98.7 F (37.1 C)    TempSrc:  Oral    SpO2: 95%  99% 95%  Weight:      Height:        Last BM Date : 10/23/23  Intake/Output   Yesterday:  11/07 0701 - 11/08 0700 In: 1406.2 [P.O.:60; I.V.:1174.4; IV Piggyback:171.8] Out: 2100 [Urine:1950; Blood:150] This shift:  No intake/output data recorded.  Bowel function:  Flatus: YES  BM:  No  Drain: (No drain)   Physical Exam:  General: Pt awake/alert in no acute distress Eyes: PERRL, normal EOM.  Sclera clear.  No icterus Neuro: CN II-XII intact w/o focal sensory/motor deficits. Lymph: No head/neck/groin lymphadenopathy Psych:  No delerium/psychosis/paranoia.  Oriented x 4 HENT: Normocephalic, Mucus membranes moist.  No thrush Neck: Supple, No tracheal deviation.  No obvious thyromegaly Chest: No pain to chest wall compression.  Good respiratory excursion.  No audible wheezing CV:  Pulses intact.  Regular rhythm.  No major extremity edema MS: Normal AROM mjr joints.  No obvious deformity  Abdomen: Soft.  Nondistended.  Nontender.  Dressings clean dry and intact.  Scant old blood in right corner of the Pfannenstiel honeycomb dressing no evidence of peritonitis.  No incarcerated hernias.  Ext:   No deformity.  No mjr edema.  No cyanosis Skin: No petechiae / purpurea.  No major sores.  Warm and dry    Results:   Cultures: Recent Results (from the past 720 hour(s))   MRSA Next Gen by PCR, Nasal     Status: None   Collection Time: 10/23/23  7:58 PM   Specimen: Nasal Mucosa; Nasal Swab  Result Value Ref Range Status   MRSA by PCR Next Gen NOT DETECTED NOT DETECTED Final    Comment: (NOTE) The GeneXpert MRSA Assay (FDA approved for NASAL specimens only), is one component of a comprehensive MRSA colonization surveillance program. It is not intended to diagnose MRSA infection nor to guide or monitor treatment for MRSA infections. Test performance is not FDA approved in patients less than 74 years old. Performed at [facility], [address] [place], [place] [address]     Labs: Results for orders placed or performed during the hospital encounter of 10/23/23 (from the past 48 hour(s))  ABO/Rh     Status: None   Collection Time: 10/23/23  6:25 AM  Result Value Ref Range   ABO/RH(D)      A POS Performed at [facility], [address] [place], [place] [address]   Glucose, capillary     Status:  Abnormal   Collection Time: 10/23/23  7:01 PM  Result Value Ref Range   Glucose-Capillary 218 (H) 70 - 99 mg/dL    Comment: Glucose reference range applies only to samples taken after fasting for at least 8 hours.  Glucose, capillary     Status: Abnormal   Collection Time: 10/23/23  7:34 PM  Result Value Ref Range   Glucose-Capillary 209 (H) 70 - 99 mg/dL    Comment: Glucose reference range applies only to samples taken after fasting for at least 8 hours.   Comment 1 Notify RN    Comment 2 Document in Chart   MRSA Next Gen by PCR, Nasal     Status: None   Collection Time: 10/23/23  7:58 PM   Specimen: Nasal Mucosa; Nasal Swab  Result Value Ref Range   MRSA by PCR Next Gen NOT DETECTED NOT DETECTED    Comment: (NOTE) The GeneXpert MRSA Assay (FDA approved for NASAL specimens only), is one component of a comprehensive MRSA colonization surveillance program. It is not intended to diagnose MRSA infection nor to  guide or monitor treatment for MRSA infections. Test performance is not FDA approved in patients less than 51 years old. Performed at [facility], [address], [place], [place] [address]   CBC     Status: Abnormal   Collection Time: 10/23/23  8:57 PM  Result Value Ref Range   WBC 11.3 (H) 4.0 - 10.5 K/uL   RBC 3.64 (L) 3.87 - 5.11 MIL/uL   Hemoglobin 11.5 (L) 12.0 - 15.0 g/dL   HCT 70.6 (L) 23.7 - 62.8 %   MCV 88.7 80.0 - 100.0 fL   MCH 31.6 26.0 - 34.0 pg   MCHC 35.6 30.0 - 36.0 g/dL   RDW 31.5 17.6 - 16.0 %   Platelets 158 150 - 400 K/uL   nRBC 0.0 0.0 - 0.2 %    Comment: Performed at [facility], [address], [place], [place] [address]  Basic metabolic panel     Status: Abnormal   Collection Time: 10/23/23  8:57 PM  Result Value Ref Range   Sodium 133 (L) 135 - 145 mmol/L   Potassium 3.5 3.5 - 5.1 mmol/L   Chloride 104 98 - 111 mmol/L   CO2 20 (L) 22 - 32 mmol/L   Glucose, Bld 185 (H) 70 - 99 mg/dL    Comment: Glucose reference range applies only to samples taken after fasting for at least 8 hours.   BUN 8 8 - 23 mg/dL   Creatinine, Ser 6.26 0.44 - 1.00 mg/dL   Calcium 8.0 (L) 8.9 - 10.3 mg/dL   GFR, Estimated >94 >85 mL/min    Comment: (NOTE) Calculated using the CKD-EPI Creatinine Equation (2021)    Anion gap 9 5 - 15    Comment: Performed at [facility], [address], [place], [place] [address]  Basic metabolic panel     Status: Abnormal   Collection Time: 10/24/23  3:10 AM  Result Value Ref Range   Sodium 138 135 - 145 mmol/L   Potassium 4.0 3.5 - 5.1 mmol/L   Chloride 107 98 - 111 mmol/L   CO2 24 22 - 32 mmol/L   Glucose, Bld 141 (H) 70 - 99 mg/dL    Comment: Glucose reference range applies only to samples taken after fasting for at least 8 hours.   BUN 8 8 - 23 mg/dL   Creatinine, Ser 3.50 0.44 - 1.00  mg/dL   Calcium 8.4 (L) 8.9 - 10.3 mg/dL   GFR, Estimated >82 >95 mL/min    Comment:  (NOTE) Calculated using the CKD-EPI Creatinine Equation (2021)    Anion gap 7 5 - 15    Comment: Performed at [facility], [address], [place], [place] [address]  CBC     Status: Abnormal   Collection Time: 10/24/23  3:10 AM  Result Value Ref Range   WBC 8.2 4.0 - 10.5 K/uL   RBC 3.42 (L) 3.87 - 5.11 MIL/uL   Hemoglobin 10.6 (L) 12.0 - 15.0 g/dL   HCT 86.5 (L) 78.4 - 69.6 %   MCV 87.7 80.0 - 100.0 fL   MCH 31.0 26.0 - 34.0 pg   MCHC 35.3 30.0 - 36.0 g/dL   RDW 29.5 28.4 - 13.2 %   Platelets 163 150 - 400 K/uL   nRBC 0.0 0.0 - 0.2 %    Comment: Performed at [facility], [address], [place], [place] [address]  Magnesium     Status: None   Collection Time: 10/24/23  3:10 AM  Result Value Ref Range   Magnesium 2.0 1.7 - 2.4 mg/dL    Comment: Performed at [facility], [address], [place], [place] [address]    Imaging / Studies: No results found.  Medications / Allergies: per chart  Antibiotics: Anti-infectives (From admission, onward)    Start     Dose/Rate Route Frequency Ordered Stop   10/23/23 1900  cefoTEtan (CEFOTAN) 2 g in sodium chloride 0.9 % 100 mL IVPB        2 g 200 mL/hr over 30 Minutes Intravenous Every 12 hours 10/23/23 1152 10/23/23 2108   10/23/23 1400  neomycin (MYCIFRADIN) tablet 1,000 mg  Status:  Discontinued       Placed in "And" Linked Group   1,000 mg Oral 3 times per day 10/23/23 0533 10/23/23 0541   10/23/23 1400  metroNIDAZOLE (FLAGYL) tablet 1,000 mg  Status:  Discontinued       Placed in "And" Linked Group   1,000 mg Oral 3 times per day 10/23/23 0533 10/23/23 0541   10/23/23 0600  cefoTEtan (CEFOTAN) 2 g in sodium chloride 0.9 % 100 mL IVPB        2 g 200 mL/hr over 30 Minutes Intravenous On call to O.R. 10/23/23 0533 10/23/23 1221         Note: Portions of this report may have been transcribed using voice recognition software. Every effort was made to ensure accuracy;  however, inadvertent computerized transcription errors may be present.   Any transcriptional errors that result from this process are unintentional.    Ardeth Sportsman, MD, FACS, MASCRS Esophageal, Gastrointestinal & Colorectal Surgery Robotic and Minimally Invasive Surgery  [facility] [address], [address] [place], [place] [address] [phone] Fax [phone] Main  CONTACT INFORMATION: Weekday (9AM-5PM): Call [facility] main office at [phone] Weeknight (5PM-9AM) or Weekend/Holiday: Check EPIC "Web Links" tab & use "AMION" (password " [email or web address]") for General Surgery [facility] coverage  Please, DO NOT use SecureChat  (it is not reliable communication to reach operating surgeons & will lead to a delay in care).   Epic staff messaging available for outptient concerns needing 1-2 business day response.      10/24/2023  10:50 AM

## 2023-10-24 NOTE — Plan of Care (Signed)
  Problem: Clinical Measurements: Goal: Postoperative complications will be avoided or minimized Outcome: Progressing   Problem: Respiratory: Goal: Respiratory status will improve Outcome: Progressing   Problem: Skin Integrity: Goal: Will show signs of wound healing Outcome: Progressing
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
## 2023-10-25 DIAGNOSIS — N824 Other female intestinal-genital tract fistulae: Secondary | ICD-10-CM | POA: Diagnosis not present

## 2023-10-25 LAB — CBC
HCT: 25.9 % — ABNORMAL LOW (ref 36.0–46.0)
HCT: 24.9 % — ABNORMAL LOW (ref 36.0–46.0)
Hemoglobin: 9.1 g/dL — ABNORMAL LOW (ref 12.0–15.0)
Hemoglobin: 8.7 g/dL — ABNORMAL LOW (ref 12.0–15.0)
MCH: 31.1 pg (ref 26.0–34.0)
MCH: 30.9 pg (ref 26.0–34.0)
MCHC: 35.1 g/dL (ref 30.0–36.0)
MCHC: 34.9 g/dL (ref 30.0–36.0)
MCV: 88.4 fL (ref 80.0–100.0)
MCV: 88.3 fL (ref 80.0–100.0)
Platelets: 147 10*3/uL — ABNORMAL LOW (ref 150–400)
Platelets: 132 10*3/uL — ABNORMAL LOW (ref 150–400)
RBC: 2.93 MIL/uL — ABNORMAL LOW (ref 3.87–5.11)
RBC: 2.82 MIL/uL — ABNORMAL LOW (ref 3.87–5.11)
RDW: 13.4 % (ref 11.5–15.5)
RDW: 13.6 % (ref 11.5–15.5)
WBC: 7.1 10*3/uL (ref 4.0–10.5)
WBC: 8.7 10*3/uL (ref 4.0–10.5)
nRBC: 0 % (ref 0.0–0.2)
nRBC: 0 % (ref 0.0–0.2)

## 2023-10-25 LAB — CREATININE, SERUM
Creatinine, Ser: 0.7 mg/dL (ref 0.44–1.00)
GFR, Estimated: >60 mL/min (ref >60)

## 2023-10-25 LAB — POTASSIUM: Potassium: 3.6 mmol/L (ref 3.5–5.1)

## 2023-10-25 MED ORDER — ORAL CARE MOUTH RINSE
15.0000 mL | OROMUCOSAL | Status: DC
  Administered 2023-10-25 – 2023-10-27 (×9): 15 mL via OROMUCOSAL

## 2023-10-25 MED ORDER — ORAL CARE MOUTH RINSE: 15.0000 mL | OROMUCOSAL | Status: DC | PRN

## 2023-10-25 NOTE — Progress Notes (Signed)
PROGRESS NOTE    Kylie Casey  WUJ:[id number] DOB: [birth year] DOA: 10/23/2023 PCP: Gilmore Laroche, FNP   Brief Narrative:  Kylie Casey is a 77 y.o. female with history of prediabetes who was admitted to the hospital by surgical service for takedown of colovaginal fistula had an episode of syncope secondary to hypotension postoperatively for which hospitalist was consulted to comanage.  Assessment & Plan:   Principal Problem:   Colovaginal fistula Active Problems:   Prediabetes   History of adenomatous polyp of colon   Syncope   Vitamin D deficiency  Syncope/orthostatic hypotension: Blood pressure improving.  No further episodes.  Denies dizziness or any other complaint.  She is not on any antihypertensives. I will keep her on my list today, will reassess her vitals tomorrow and likely see her in person if needed.  Status post takedown of colovaginal fistula: RN informed me that patient is likely bleeding and neurosurgery has been notified and they are planning to repeat hemoglobin later.  Monitor blood pressure closely.  Defer management to primary service.  DVT prophylaxis: SCD's Start: 10/23/23 1152   Code Status: Full Code  Family Communication:  None present at bedside.  Plan of care discussed with patient in length and he/she verbalized understanding and agreed with it.  Status is: Inpatient Remains inpatient appropriate because: Per general surgery.   Estimated body mass index is 27.58 kg/m as calculated from the following:   Height as of this encounter: 5\' 1"  (1.549 m).   Weight as of this encounter: 66.2 kg.    Nutritional Assessment: Body mass index is 27.58 kg/m.Marland Kitchen Seen by dietician.  I agree with the assessment and plan as outlined below: Nutrition Status:        . Skin Assessment: I have examined the patient's skin and I agree with the wound assessment as performed by the wound care RN as outlined below:    Consultants:  TRH  Procedures:  As  above  Antimicrobials:  Anti-infectives (From admission, onward)    Start     Dose/Rate Route Frequency Ordered Stop   10/23/23 1900  cefoTEtan (CEFOTAN) 2 g in sodium chloride 0.9 % 100 mL IVPB        2 g 200 mL/hr over 30 Minutes Intravenous Every 12 hours 10/23/23 1152 10/23/23 2108   10/23/23 1400  neomycin (MYCIFRADIN) tablet 1,000 mg  Status:  Discontinued       Placed in "And" Linked Group   1,000 mg Oral 3 times per day 10/23/23 0533 10/23/23 0541   10/23/23 1400  metroNIDAZOLE (FLAGYL) tablet 1,000 mg  Status:  Discontinued       Placed in "And" Linked Group   1,000 mg Oral 3 times per day 10/23/23 0533 10/23/23 0541   10/23/23 0600  cefoTEtan (CEFOTAN) 2 g in sodium chloride 0.9 % 100 mL IVPB        2 g 200 mL/hr over 30 Minutes Intravenous On call to O.R. 10/23/23 0533 10/23/23 1221         Subjective: Seen and examined.  Patient has no complaints.  Objective: Vitals:   10/25/23 0730 10/25/23 0800 10/25/23 0840 10/25/23 0945  BP:  136/69    Pulse: 71 83  83  Resp: 11 (!) 21  19  Temp:   99.1 F (37.3 C)   TempSrc:   Oral   SpO2: 99% 98%  99%  Weight:      Height:        Intake/Output Summary (Last  24 hours) at 10/25/2023 1124 Last data filed at 10/25/2023 1017 Gross per 24 hour  Intake 861.99 ml  Output --  Net 861.99 ml   Filed Weights   10/23/23 1210 10/24/23 0500 10/25/23 0500  Weight: 63.7 kg 65.7 kg 66.2 kg    Examination:  General exam: Appears calm and comfortable  Respiratory system: Clear to auscultation. Respiratory effort normal. Cardiovascular system: S1 & S2 heard, RRR. No JVD, murmurs, rubs, gallops or clicks. No pedal edema. Gastrointestinal system: Abdomen is nondistended, soft and lower abdominal tenderness. No organomegaly or masses felt. Normal bowel sounds heard. Central nervous system: Alert and oriented. No focal neurological deficits. Extremities: Symmetric 5 x 5 power. Skin: No rashes, lesions or ulcers Psychiatry:  Judgement and insight appear normal. Mood & affect appropriate.    Data Reviewed: I have personally reviewed following labs and imaging studies  CBC: Recent Labs  Lab 10/23/23 2057 10/24/23 0310 10/25/23 0341  WBC 11.3* 8.2 7.1  HGB 11.5* 10.6* 9.1*  HCT 32.3* 30.0* 25.9*  MCV 88.7 87.7 88.4  PLT 158 163 147*   Basic Metabolic Panel: Recent Labs  Lab 10/23/23 2057 10/24/23 0310 10/25/23 0341  NA 133* 138  --   K 3.5 4.0 3.6  CL 104 107  --   CO2 20* 24  --   GLUCOSE 185* 141*  --   BUN 8 8  --   CREATININE 0.87 0.86 0.70  CALCIUM 8.0* 8.4*  --   MG  --  2.0  --    GFR: Estimated Creatinine Clearance: 50.5 mL/min (by C-G formula based on SCr of 0.7 mg/dL). Liver Function Tests: No results for input(s): "AST", "ALT", "ALKPHOS", "BILITOT", "PROT", "ALBUMIN" in the last 168 hours. No results for input(s): "LIPASE", "AMYLASE" in the last 168 hours. No results for input(s): "AMMONIA" in the last 168 hours. Coagulation Profile: No results for input(s): "INR", "PROTIME" in the last 168 hours. Cardiac Enzymes: No results for input(s): "CKTOTAL", "CKMB", "CKMBINDEX", "TROPONINI" in the last 168 hours. BNP (last 3 results) No results for input(s): "PROBNP" in the last 8760 hours. HbA1C: No results for input(s): "HGBA1C" in the last 72 hours. CBG: Recent Labs  Lab 10/23/23 1901 10/23/23 1934  GLUCAP 218* 209*   Lipid Profile: No results for input(s): "CHOL", "HDL", "LDLCALC", "TRIG", "CHOLHDL", "LDLDIRECT" in the last 72 hours. Thyroid Function Tests: No results for input(s): "TSH", "T4TOTAL", "FREET4", "T3FREE", "THYROIDAB" in the last 72 hours. Anemia Panel: No results for input(s): "VITAMINB12", "FOLATE", "FERRITIN", "TIBC", "IRON", "RETICCTPCT" in the last 72 hours. Sepsis Labs: No results for input(s): "PROCALCITON", "LATICACIDVEN" in the last 168 hours.  Recent Results (from the past 240 hour(s))  MRSA Next Gen by PCR, Nasal     Status: None   Collection  Time: 10/23/23  7:58 PM   Specimen: Nasal Mucosa; Nasal Swab  Result Value Ref Range Status   MRSA by PCR Next Gen NOT DETECTED NOT DETECTED Final    Comment: (NOTE) The GeneXpert MRSA Assay (FDA approved for NASAL specimens only), is one component of a comprehensive MRSA colonization surveillance program. It is not intended to diagnose MRSA infection nor to guide or monitor treatment for MRSA infections. Test performance is not FDA approved in patients less than 7 years old. Performed at [facility], [address] [place], [place] [address]      Radiology Studies: No results found.  Scheduled Meds:  acetaminophen  1,000 mg Oral Q6H   alvimopan  12 mg Oral  BID   Chlorhexidine Gluconate Cloth  6 each Topical Daily   cholecalciferol  1,000 Units Oral Daily   feeding supplement  237 mL Oral BID BM   multivitamin with minerals  1 tablet Oral Daily   polycarbophil  625 mg Oral BID   sodium chloride flush  3 mL Intravenous Q12H   Continuous Infusions:  sodium chloride       LOS: 2 days   Hughie Closs, MD [facility]  10/25/2023, 11:24 AM   *Please note that this is a verbal dictation therefore any spelling or grammatical errors are due to the "Dragon Medical One" system interpretation.  Please page via Amion and do not message via secure chat for urgent patient care matters. Secure chat can be used for non urgent patient care matters.  How to contact the [facility] Attending or Consulting provider 7A - 7P or covering provider during after hours 7P -7A, for this patient?  Check the care team in [facility] and look for a) attending/consulting [facility] provider listed and b) the [facility] team listed. Page or secure chat 7A-7P. Log into [email or web address] and use [facility] universal password to access. If you do not have the password, please contact the hospital operator. Locate the [facility] provider you are looking for under [facility] and page to a number that you can be  directly reached. If you still have difficulty reaching the provider, please page the [facility] (Director on Call) for the Hospitalists listed on amion for assistance.

## 2023-10-25 NOTE — Progress Notes (Signed)
Patient ID: Kylie Casey, female   DOB: 1946/05/05, 77 y.o.   MRN: [id number]   Patient had BRBPR  Suspect it is from the bowel anastomosis.  I should stop with holding lovenox Will leave in the unit and resume telemetry Repeat Hgb at 4 pm

## 2023-10-25 NOTE — Progress Notes (Signed)
PT Cancellation Note  Patient Details Name: Kylie Casey MRN: [id number] DOB: Apr 24, 1946   Cancelled Treatment:     PT order received but eval deferred at request of RN, pt with ongoing frequent episodes of bloody diarrhea.  Will follow.Marland Kitchen   Elexius Minar 10/25/2023, 11:10 AM

## 2023-10-25 NOTE — Progress Notes (Signed)
2 Days Post-Op   Subjective/Chief Complaint: Having BM's Has remained hemodynamically stable Pain controlled   Objective: Vital signs in last 24 hours: Temp:  [98 F (36.7 C)-99.2 F (37.3 C)] 98 F (36.7 C) (11/09 0401) Pulse Rate:  [72-89] 78 (11/09 0401) Resp:  [11-29] 29 (11/09 0401) BP: (109-158)/(59-81) 137/73 (11/09 0401) SpO2:  [92 %-100 %] 100 % (11/09 0401) Weight:  [66.2 kg] 66.2 kg (11/09 0500) Last BM Date : 10/24/23  Intake/Output from previous day: 11/08 0701 - 11/09 0700 In: 859 [I.V.:754.9; IV Piggyback:104.1] Out: -  Intake/Output this shift: No intake/output data recorded.  Exam: Awake and alert Abdomen soft, non-distended, dressing dry  Lab Results:  Recent Labs    10/24/23 0310 10/25/23 0341  WBC 8.2 7.1  HGB 10.6* 9.1*  HCT 30.0* 25.9*  PLT 163 147*   BMET Recent Labs    10/23/23 2057 10/24/23 0310 10/25/23 0341  NA 133* 138  --   K 3.5 4.0 3.6  CL 104 107  --   CO2 20* 24  --   GLUCOSE 185* 141*  --   BUN 8 8  --   CREATININE 0.87 0.86 0.70  CALCIUM 8.0* 8.4*  --    PT/INR No results for input(s): "LABPROT", "INR" in the last 72 hours. ABG No results for input(s): "PHART", "HCO3" in the last 72 hours.  Invalid input(s): "PCO2", "PO2"  Studies/Results: No results found.  Anti-infectives: Anti-infectives (From admission, onward)    Start     Dose/Rate Route Frequency Ordered Stop   10/23/23 1900  cefoTEtan (CEFOTAN) 2 g in sodium chloride 0.9 % 100 mL IVPB        2 g 200 mL/hr over 30 Minutes Intravenous Every 12 hours 10/23/23 1152 10/23/23 2108   10/23/23 1400  neomycin (MYCIFRADIN) tablet 1,000 mg  Status:  Discontinued       Placed in "And" Linked Group   1,000 mg Oral 3 times per day 10/23/23 0533 10/23/23 0541   10/23/23 1400  metroNIDAZOLE (FLAGYL) tablet 1,000 mg  Status:  Discontinued       Placed in "And" Linked Group   1,000 mg Oral 3 times per day 10/23/23 0533 10/23/23 0541   10/23/23 0600  cefoTEtan  (CEFOTAN) 2 g in sodium chloride 0.9 % 100 mL IVPB        2 g 200 mL/hr over 30 Minutes Intravenous On call to O.R. 10/23/23 0533 10/23/23 1221       Assessment/Plan: s/p Procedure(s) with comments: ROBOTIC RESECTION OF COLON RECTOSIGMOID (N/A) - ROBOTIC LOW ANTERIOR RECTOSIGMOID RESECTION TAKEDOWN & REPAIR OF COLOVAGINAL FISTULA EN-BLOC LEFT SALPINGO-OOPHORECTOMY INTRAOPERATIVE ASSESSMENT OF TISSUE VASCULAR PERFUSION USING ICG (indocyanine green) IMMUNOFLUORESCENCE TRANSVERSUS ABDOMINIS PLANE (TAP) BLOCK - BILATERAL FLEXIBLE SIGMOIDOSCOPY CYSTOSCOPY with FIREFLY INJECTION (N/A)   Hgb down a little but hemodynamically stable.  Will hold lovenox Bowel function returning Will transfer back to the floor   LOS: 2 days    Abigail Miyamoto MD 10/25/2023

## 2023-10-25 NOTE — Progress Notes (Signed)
OT Cancellation Note  Patient Details Name: Kylie Casey MRN: [id number] DOB: Jul 14, 1946   Cancelled Treatment:    Reason Eval/Treat Not Completed: Medical issues which prohibited therapy Nursing asking for therapy to hold off at this time with patient having continued bloody stools. OT to continue to folllow [facility] OTR/L, MS Acute Rehabilitation Department Office# [phone]  10/25/2023, 12:55 PM

## 2023-10-26 DIAGNOSIS — N824 Other female intestinal-genital tract fistulae: Secondary | ICD-10-CM | POA: Diagnosis not present

## 2023-10-26 LAB — HEMOGLOBIN AND HEMATOCRIT, BLOOD
HCT: 24 % — ABNORMAL LOW (ref 36.0–46.0)
Hemoglobin: 8.6 g/dL — ABNORMAL LOW (ref 12.0–15.0)

## 2023-10-26 LAB — CBC
HCT: 23.4 % — ABNORMAL LOW (ref 36.0–46.0)
Hemoglobin: 8.2 g/dL — ABNORMAL LOW (ref 12.0–15.0)
MCH: 31.1 pg (ref 26.0–34.0)
MCHC: 35 g/dL (ref 30.0–36.0)
MCV: 88.6 fL (ref 80.0–100.0)
Platelets: 149 10*3/uL — ABNORMAL LOW (ref 150–400)
RBC: 2.64 MIL/uL — ABNORMAL LOW (ref 3.87–5.11)
RDW: 13.6 % (ref 11.5–15.5)
WBC: 7.7 10*3/uL (ref 4.0–10.5)
nRBC: 0 % (ref 0.0–0.2)

## 2023-10-26 LAB — POTASSIUM: Potassium: 2.9 mmol/L — ABNORMAL LOW (ref 3.5–5.1)

## 2023-10-26 LAB — CREATININE, SERUM
Creatinine, Ser: 0.57 mg/dL (ref 0.44–1.00)
GFR, Estimated: >60 mL/min (ref >60)

## 2023-10-26 MED ORDER — POTASSIUM CHLORIDE 10 MEQ/100ML IV SOLN
10.0000 meq | INTRAVENOUS | Status: AC
  Administered 2023-10-26 (×4): 10 meq via INTRAVENOUS
  Filled 2023-10-26 (×2): qty 100

## 2023-10-26 NOTE — Evaluation (Addendum)
Occupational Therapy Evaluation Patient Details Name: Kylie Casey MRN: [id number] DOB: 05-Feb-1946 Today's Date: 10/26/2023   History of Present Illness Patient is a 77 year old female who presented to the hospital for takedown of colovaginal fistula. Patient had an episode of syncope secondary to hypotension post op with transition to SDU. PMH: prediabetes.   Clinical Impression   Patient is a  77 year old female who was admitted for above. Patient was living at home alone with flight of steps to reach apartment with family living in the area. Patient was able to engage in functional mobility during session with VSS. Patient was educated on weight shifting in bed and sitting in recliner to increase independence. Patient was also educated on ECT. Patient plans to d/c home with family support and [facility] services. Patient would continue to benefit from skilled OT services at this time while admitted and after d/c to address noted deficits in order to improve overall safety and independence in ADLs.        If plan is discharge home, recommend the following: A little help with walking and/or transfers;A little help with bathing/dressing/bathroom;Assistance with cooking/housework;Direct supervision/assist for medications management;Assist for transportation;Help with stairs or ramp for entrance;Direct supervision/assist for financial management    Functional Status Assessment  Patient has had a recent decline in their functional status and demonstrates the ability to make significant improvements in function in a reasonable and predictable amount of time.  Equipment Recommendations  None recommended by OT       Precautions / Restrictions Precautions Precautions: Fall Restrictions Weight Bearing Restrictions: No      Mobility Bed Mobility Overal bed mobility: Needs Assistance Bed Mobility: Supine to Sit     Supine to sit: Supervision     General bed mobility comments: with increased time  and cues for rolling            ADL either performed or assessed with clinical judgement   ADL Overall ADL's : Needs assistance/impaired Eating/Feeding: Modified independent;Sitting Eating/Feeding Details (indicate cue type and reason): was able to use phone to call for meal herself in room. Grooming: Sitting;Set up   Upper Body Bathing: Sitting;Set up   Lower Body Bathing: Minimal assistance;Sitting/lateral leans   Upper Body Dressing : Set up;Sitting   Lower Body Dressing: Minimal assistance;Sitting/lateral leans   Toilet Transfer: Contact guard assist;Ambulation;Rolling walker (2 wheels) Toilet Transfer Details (indicate cue type and reason): with VSS ambulation around whole unit. Toileting- Clothing Manipulation and Hygiene: Maximal assistance;Sit to/from stand         General ADL Comments: patient reported having breakdown on bottom from frequent bowel movements and hygiene. patient was educated on pressure relief strategies in bed and sitting in recliner. patient verbalized understanding.     Vision Baseline Vision/History: 1 Wears glasses Vision Assessment?: No apparent visual deficits;Wears glasses for reading            Pertinent Vitals/Pain Pain Assessment Pain Assessment: No/denies pain     Extremity/Trunk Assessment Upper Extremity Assessment Upper Extremity Assessment: Overall WFL for tasks assessed   Lower Extremity Assessment Lower Extremity Assessment: Overall WFL for tasks assessed   Cervical / Trunk Assessment Cervical / Trunk Assessment: Normal      Cognition Arousal: Alert Behavior During Therapy: WFL for tasks assessed/performed Overall Cognitive Status: Within Functional Limits for tasks assessed         General Comments: plesant and cooperative  Home Living Family/patient expects to be discharged to:: Private residence Living Arrangements: Alone Available Help at Discharge: Family;Available  PRN/intermittently Type of Home: Apartment Home Access: Stairs to enter Entrance Stairs-Number of Steps: flight 16 Entrance Stairs-Rails: Right Home Layout: One level     Bathroom Shower/Tub: Tub/shower unit         Home Equipment: None          Prior Functioning/Environment Prior Level of Function : Independent/Modified Independent                        OT Problem List: Decreased activity tolerance;Impaired balance (sitting and/or standing);Decreased coordination;Decreased safety awareness;Decreased knowledge of use of DME or AE;Decreased knowledge of precautions      OT Treatment/Interventions: Self-care/ADL training;Therapeutic exercise;DME and/or AE instruction;Therapeutic activities;Patient/family education;Balance training    OT Goals(Current goals can be found in the care plan section) Acute Rehab OT Goals Patient Stated Goal: to get back to her daily routines OT Goal Formulation: With patient Time For Goal Achievement: 11/09/23 Potential to Achieve Goals: Fair  OT Frequency: Min 1X/week    Co-evaluation PT/OT/SLP Co-Evaluation/Treatment: Yes Reason for Co-Treatment: To address functional/ADL transfers PT goals addressed during session: Mobility/safety with mobility OT goals addressed during session: ADL's and self-care      AM-PAC OT "6 Clicks" Daily Activity     Outcome Measure Help from another person eating meals?: None Help from another person taking care of personal grooming?: A Little Help from another person toileting, which includes using toliet, bedpan, or urinal?: A Little Help from another person bathing (including washing, rinsing, drying)?: A Little Help from another person to put on and taking off regular upper body clothing?: A Little Help from another person to put on and taking off regular lower body clothing?: A Little 6 Click Score: 19   End of Session Equipment Utilized During Treatment: Gait belt;Rolling walker (2  wheels) Nurse Communication: Mobility status  Activity Tolerance: Patient tolerated treatment well Patient left: in chair;with call bell/phone within reach  OT Visit Diagnosis: Unsteadiness on feet (R26.81);Other abnormalities of gait and mobility (R26.89);Muscle weakness (generalized) (M62.81)                Time: 3086-5784 OT Time Calculation (min): 28 min Charges:  OT General Charges $OT Visit: 1 Visit OT Evaluation $OT Eval Moderate Complexity: 1 Mod  Katlin Bortner OTR/L, MS Acute Rehabilitation Department Office# [phone]   Selinda Flavin 10/26/2023, 3:12 PM

## 2023-10-26 NOTE — Plan of Care (Signed)
  Problem: Education: Goal: Understanding of discharge needs will improve Outcome: Progressing Goal: Verbalization of understanding of the causes of altered bowel function will improve Outcome: Progressing

## 2023-10-26 NOTE — Progress Notes (Signed)
3 Days Post-Op   Subjective/Chief Complaint: Having more BM's now with minimal blood Reports minimal pain Tolerating po Has remained hemodynamically stable   Objective: Vital signs in last 24 hours: Temp:  [98 F (36.7 C)-99.2 F (37.3 C)] 98.4 F (36.9 C) (11/10 0329) Pulse Rate:  [79-91] 80 (11/10 0600) Resp:  [12-26] 13 (11/10 0600) BP: (95-136)/(54-79) 115/60 (11/10 0600) SpO2:  [95 %-100 %] 99 % (11/10 0600) Weight:  [67.2 kg] 67.2 kg (11/10 0421) Last BM Date : 10/25/23  Intake/Output from previous day: 11/09 0701 - 11/10 0700 In: 224.1 [P.O.:60; I.V.:7.4; IV Piggyback:156.8] Out: 1701 [Urine:1700; Stool:1] Intake/Output this shift: No intake/output data recorded.  Exam: Awake and alert Abdomen soft, dressing dry  Lab Results:  Recent Labs    10/25/23 1540 10/26/23 0310  WBC 8.7 7.7  HGB 8.7* 8.2*  HCT 24.9* 23.4*  PLT 132* 149*   BMET Recent Labs    10/23/23 2057 10/24/23 0310 10/25/23 0341 10/26/23 0310  NA 133* 138  --   --   K 3.5 4.0 3.6 2.9*  CL 104 107  --   --   CO2 20* 24  --   --   GLUCOSE 185* 141*  --   --   BUN 8 8  --   --   CREATININE 0.87 0.86 0.70 0.57  CALCIUM 8.0* 8.4*  --   --    PT/INR No results for input(s): "LABPROT", "INR" in the last 72 hours. ABG No results for input(s): "PHART", "HCO3" in the last 72 hours.  Invalid input(s): "PCO2", "PO2"  Studies/Results: No results found.  Anti-infectives: Anti-infectives (From admission, onward)    Start     Dose/Rate Route Frequency Ordered Stop   10/23/23 1900  cefoTEtan (CEFOTAN) 2 g in sodium chloride 0.9 % 100 mL IVPB        2 g 200 mL/hr over 30 Minutes Intravenous Every 12 hours 10/23/23 1152 10/23/23 2108   10/23/23 1400  neomycin (MYCIFRADIN) tablet 1,000 mg  Status:  Discontinued       Placed in "And" Linked Group   1,000 mg Oral 3 times per day 10/23/23 0533 10/23/23 0541   10/23/23 1400  metroNIDAZOLE (FLAGYL) tablet 1,000 mg  Status:  Discontinued        Placed in "And" Linked Group   1,000 mg Oral 3 times per day 10/23/23 0533 10/23/23 0541   10/23/23 0600  cefoTEtan (CEFOTAN) 2 g in sodium chloride 0.9 % 100 mL IVPB        2 g 200 mL/hr over 30 Minutes Intravenous On call to O.R. 10/23/23 0533 10/23/23 1221       Assessment/Plan: s/p Procedure(s) with comments: ROBOTIC RESECTION OF COLON RECTOSIGMOID (N/A) - ROBOTIC LOW ANTERIOR RECTOSIGMOID RESECTION TAKEDOWN & REPAIR OF COLOVAGINAL FISTULA EN-BLOC LEFT SALPINGO-OOPHORECTOMY INTRAOPERATIVE ASSESSMENT OF TISSUE VASCULAR PERFUSION USING ICG (indocyanine green) IMMUNOFLUORESCENCE TRANSVERSUS ABDOMINIS PLANE (TAP) BLOCK - BILATERAL FLEXIBLE SIGMOIDOSCOPY CYSTOSCOPY with FIREFLY INJECTION (N/A)   Bleeding from anastomosis likely stopped Hgb stable.  Repeat later today.  Hold on transfusion unless she becomes symptomatic Replacing low potassium IV   LOS: 3 days    Abigail Miyamoto 10/26/2023

## 2023-10-26 NOTE — Progress Notes (Signed)
PROGRESS NOTE    Kylie Casey  KGM:[id number] DOB: 1946-07-07 DOA: 10/23/2023 PCP: Gilmore Laroche, FNP   Brief Narrative:  Kylie Casey is a 77 y.o. female with history of prediabetes who was admitted to the hospital by surgical service for takedown of colovaginal fistula had an episode of syncope secondary to hypotension postoperatively for which hospitalist was consulted to comanage.  Assessment & Plan:   Principal Problem:   Colovaginal fistula Active Problems:   Prediabetes   History of adenomatous polyp of colon   Syncope   Vitamin D deficiency  Syncope/orthostatic hypotension: Blood pressure improving.  No further episodes.  Denies dizziness or any other complaint.  She is not on any antihypertensives. We checked her orthostatics today and that were within normal range.  No further need for hospitalist to continue to manage.  We will sign off.  Please let us know if ever services are required again.  Thank you for this consultation.  Status post takedown of colovaginal fistula: Agement per primary service/general surgery.  DVT prophylaxis: SCD's Start: 10/23/23 1152   Code Status: Full Code  Family Communication:  None present at bedside.  Plan of care discussed with patient in length and he/she verbalized understanding and agreed with it.  Status is: Inpatient Remains inpatient appropriate because: Per general surgery.   Estimated body mass index is 27.99 kg/m as calculated from the following:   Height as of this encounter: 5\' 1"  (1.549 m).   Weight as of this encounter: 67.2 kg.    Nutritional Assessment: Body mass index is 27.99 kg/m.Marland Kitchen Seen by dietician.  I agree with the assessment and plan as outlined below: Nutrition Status:        . Skin Assessment: I have examined the patient's skin and I agree with the wound assessment as performed by the wound care RN as outlined below:    Consultants:  TRH  Procedures:  As above  Antimicrobials:   Anti-infectives (From admission, onward)    Start     Dose/Rate Route Frequency Ordered Stop   10/23/23 1900  cefoTEtan (CEFOTAN) 2 g in sodium chloride 0.9 % 100 mL IVPB        2 g 200 mL/hr over 30 Minutes Intravenous Every 12 hours 10/23/23 1152 10/23/23 2108   10/23/23 1400  neomycin (MYCIFRADIN) tablet 1,000 mg  Status:  Discontinued       Placed in "And" Linked Group   1,000 mg Oral 3 times per day 10/23/23 0533 10/23/23 0541   10/23/23 1400  metroNIDAZOLE (FLAGYL) tablet 1,000 mg  Status:  Discontinued       Placed in "And" Linked Group   1,000 mg Oral 3 times per day 10/23/23 0533 10/23/23 0541   10/23/23 0600  cefoTEtan (CEFOTAN) 2 g in sodium chloride 0.9 % 100 mL IVPB        2 g 200 mL/hr over 30 Minutes Intravenous On call to O.R. 10/23/23 0533 10/23/23 1221         Subjective: Patient seen and examined.  Sister at the bedside.  Patient has no complaints.  Objective: Vitals:   10/26/23 0800 10/26/23 0830 10/26/23 0900 10/26/23 0920  BP: 138/74  133/78   Pulse: 77 82 83   Resp: 15 15 14    Temp:    98.8 F (37.1 C)  TempSrc:    Oral  SpO2: 97% 95% 100%   Weight:      Height:        Intake/Output Summary (Last  24 hours) at 10/26/2023 1028 Last data filed at 10/26/2023 1025 Gross per 24 hour  Intake 544.98 ml  Output 1701 ml  Net -1156.02 ml   Filed Weights   10/24/23 0500 10/25/23 0500 10/26/23 0421  Weight: 65.7 kg 66.2 kg 67.2 kg    Examination:  General exam: Appears calm and comfortable  Respiratory system: Clear to auscultation. Respiratory effort normal. Cardiovascular system: S1 & S2 heard, RRR. No JVD, murmurs, rubs, gallops or clicks. No pedal edema. Gastrointestinal system: Abdomen is nondistended, soft and nontender. No organomegaly or masses felt. Normal bowel sounds heard. Central nervous system: Alert and oriented. No focal neurological deficits. Extremities: Symmetric 5 x 5 power. Skin: No rashes, lesions or ulcers.  Psychiatry:  Judgement and insight appear normal. Mood & affect appropriate.   Data Reviewed: I have personally reviewed following labs and imaging studies  CBC: Recent Labs  Lab 10/23/23 2057 10/24/23 0310 10/25/23 0341 10/25/23 1540 10/26/23 0310  WBC 11.3* 8.2 7.1 8.7 7.7  HGB 11.5* 10.6* 9.1* 8.7* 8.2*  HCT 32.3* 30.0* 25.9* 24.9* 23.4*  MCV 88.7 87.7 88.4 88.3 88.6  PLT 158 163 147* 132* 149*   Basic Metabolic Panel: Recent Labs  Lab 10/23/23 2057 10/24/23 0310 10/25/23 0341 10/26/23 0310  NA 133* 138  --   --   K 3.5 4.0 3.6 2.9*  CL 104 107  --   --   CO2 20* 24  --   --   GLUCOSE 185* 141*  --   --   BUN 8 8  --   --   CREATININE 0.87 0.86 0.70 0.57  CALCIUM 8.0* 8.4*  --   --   MG  --  2.0  --   --    GFR: Estimated Creatinine Clearance: 50.9 mL/min (by C-G formula based on SCr of 0.57 mg/dL). Liver Function Tests: No results for input(s): "AST", "ALT", "ALKPHOS", "BILITOT", "PROT", "ALBUMIN" in the last 168 hours. No results for input(s): "LIPASE", "AMYLASE" in the last 168 hours. No results for input(s): "AMMONIA" in the last 168 hours. Coagulation Profile: No results for input(s): "INR", "PROTIME" in the last 168 hours. Cardiac Enzymes: No results for input(s): "CKTOTAL", "CKMB", "CKMBINDEX", "TROPONINI" in the last 168 hours. BNP (last 3 results) No results for input(s): "PROBNP" in the last 8760 hours. HbA1C: No results for input(s): "HGBA1C" in the last 72 hours. CBG: Recent Labs  Lab 10/23/23 1901 10/23/23 1934  GLUCAP 218* 209*   Lipid Profile: No results for input(s): "CHOL", "HDL", "LDLCALC", "TRIG", "CHOLHDL", "LDLDIRECT" in the last 72 hours. Thyroid Function Tests: No results for input(s): "TSH", "T4TOTAL", "FREET4", "T3FREE", "THYROIDAB" in the last 72 hours. Anemia Panel: No results for input(s): "VITAMINB12", "FOLATE", "FERRITIN", "TIBC", "IRON", "RETICCTPCT" in the last 72 hours. Sepsis Labs: No results for input(s): "PROCALCITON",  "LATICACIDVEN" in the last 168 hours.  Recent Results (from the past 240 hour(s))  MRSA Next Gen by PCR, Nasal     Status: None   Collection Time: 10/23/23  7:58 PM   Specimen: Nasal Mucosa; Nasal Swab  Result Value Ref Range Status   MRSA by PCR Next Gen NOT DETECTED NOT DETECTED Final    Comment: (NOTE) The GeneXpert MRSA Assay (FDA approved for NASAL specimens only), is one component of a comprehensive MRSA colonization surveillance program. It is not intended to diagnose MRSA infection nor to guide or monitor treatment for MRSA infections. Test performance is not FDA approved in patients less than 1 years old. Performed at  [facility], [address], [place], [place] [address]      Radiology Studies: No results found.  Scheduled Meds:  acetaminophen  1,000 mg Oral Q6H   Chlorhexidine Gluconate Cloth  6 each Topical Daily   cholecalciferol  1,000 Units Oral Daily   feeding supplement  237 mL Oral BID BM   multivitamin with minerals  1 tablet Oral Daily   mouth rinse  15 mL Mouth Rinse 4 times per day   polycarbophil  625 mg Oral BID   sodium chloride flush  3 mL Intravenous Q12H   Continuous Infusions:  sodium chloride Stopped (10/26/23 1023)   potassium chloride Stopped (10/26/23 1023)     LOS: 3 days   Hughie Closs, MD [facility]  10/26/2023, 10:28 AM   *Please note that this is a verbal dictation therefore any spelling or grammatical errors are due to the "Dragon Medical One" system interpretation.  Please page via Amion and do not message via secure chat for urgent patient care matters. Secure chat can be used for non urgent patient care matters.  How to contact the [facility] Attending or Consulting provider 7A - 7P or covering provider during after hours 7P -7A, for this patient?  Check the care team in [facility] and look for a) attending/consulting TRH provider listed and b) the [facility] team listed. Page or secure chat 7A-7P. Log into  [email or web address] and use [facility] universal password to access. If you do not have the password, please contact the hospital operator. Locate the [facility] provider you are looking for under [facility] and page to a number that you can be directly reached. If you still have difficulty reaching the provider, please page the [facility] (Director on Call) for the Hospitalists listed on amion for assistance.

## 2023-10-26 NOTE — Evaluation (Signed)
Physical Therapy Evaluation Patient Details Name: Kylie Casey MRN: [id number] DOB: 1946-12-16 Today's Date: 10/26/2023  History of Present Illness  Patient is a 77 year old female who presented to the hospital for takedown of colovaginal fistula. Patient had an episode of syncope secondary to hypotension post op with transition to SDU. PMH: prediabetes.  Clinical Impression  Pt admitted as above and presenting with functional mobility limitations 2* generalized weakness and mild balance deficits limiting functional mobility.  Pt very motivated and should progress well to dc home with intermittent assist of family and friends.        If plan is discharge home, recommend the following:     Can travel by private vehicle        Equipment Recommendations Other (comment) (Likely no equipment needs but TBD closer to dc)  Recommendations for Other Services       Functional Status Assessment Patient has had a recent decline in their functional status and demonstrates the ability to make significant improvements in function in a reasonable and predictable amount of time.     Precautions / Restrictions Precautions Precautions: Fall Restrictions Weight Bearing Restrictions: No      Mobility  Bed Mobility                    Transfers Overall transfer level: Needs assistance Equipment used: Rolling walker (2 wheels) Transfers: Sit to/from Stand Sit to Stand: Min assist           General transfer comment: Steady assist with cues for use of UEs to self assist    Ambulation/Gait Ambulation/Gait assistance: Min assist Gait Distance (Feet): 350 Feet Assistive device: Rolling walker (2 wheels) Gait Pattern/deviations: Step-to pattern, Step-through pattern, Decreased step length - right, Decreased step length - left, Shuffle, Trunk flexed       General Gait Details: Cues for posture and position from RW; Noted initial instability improving with increased distance  ambulated but pt stating she feels better holding to RW at this time  Stairs            Wheelchair Mobility     Tilt Bed    Modified Rankin (Stroke Patients Only)       Balance Overall balance assessment: Needs assistance Sitting-balance support: No upper extremity supported, Feet supported Sitting balance-Leahy Scale: Good     Standing balance support: No upper extremity supported Standing balance-Leahy Scale: Fair                               Pertinent Vitals/Pain Pain Assessment Pain Assessment: No/denies pain    Home Living Family/patient expects to be discharged to:: Private residence Living Arrangements: Alone Available Help at Discharge: Family;Available PRN/intermittently Type of Home: Apartment Home Access: Stairs to enter Entrance Stairs-Rails: Right Entrance Stairs-Number of Steps: flight 16   Home Layout: One level Home Equipment: None      Prior Function Prior Level of Function : Independent/Modified Independent                     Extremity/Trunk Assessment   Upper Extremity Assessment Upper Extremity Assessment: Overall WFL for tasks assessed    Lower Extremity Assessment Lower Extremity Assessment: Overall WFL for tasks assessed    Cervical / Trunk Assessment Cervical / Trunk Assessment: Normal  Communication   Communication Communication: No apparent difficulties  Cognition Arousal: Alert Behavior During Therapy: WFL for tasks assessed/performed Overall Cognitive Status:  Within Functional Limits for tasks assessed                                 General Comments: plesant and cooperative        General Comments      Exercises     Assessment/Plan    PT Assessment Patient needs continued PT services  PT Problem List Decreased strength;Decreased range of motion       PT Treatment Interventions      PT Goals (Current goals can be found in the Care Plan section)  Acute Rehab PT  Goals Patient Stated Goal: Regain IND and return home PT Goal Formulation: With patient Time For Goal Achievement: 11/09/23 Potential to Achieve Goals: Good    Frequency       Co-evaluation PT/OT/SLP Co-Evaluation/Treatment: Yes Reason for Co-Treatment: To address functional/ADL transfers PT goals addressed during session: Mobility/safety with mobility OT goals addressed during session: ADL's and self-care       AM-PAC PT "6 Clicks" Mobility  Outcome Measure                  End of Session Equipment Utilized During Treatment: Gait belt Activity Tolerance: Patient tolerated treatment well Patient left: in chair;with call bell/phone within reach;with nursing/sitter in room Nurse Communication: Mobility status PT Visit Diagnosis: Unsteadiness on feet (R26.81);Muscle weakness (generalized) (M62.81)    Time: 4098-1191 PT Time Calculation (min) (ACUTE ONLY): 25 min   Charges:   PT Evaluation $PT Eval Low Complexity: 1 Low   PT General Charges $$ ACUTE PT VISIT: 1 Visit         Mauro Kaufmann PT Acute Rehabilitation Services Pager [phone] Office [phone]   Raul Torrance 10/26/2023, 2:55 PM

## 2023-10-26 NOTE — Plan of Care (Signed)
  Problem: Education: Goal: Verbalization of understanding of the causes of altered bowel function will improve Outcome: Progressing   Problem: Activity: Goal: Ability to tolerate increased activity will improve Outcome: Progressing Note: Walked in the hallway with PT today   Problem: Bowel/Gastric: Goal: Gastrointestinal status for postoperative course will improve Outcome: Progressing   Problem: Nutritional: Goal: Will attain and maintain optimal nutritional status will improve Outcome: Progressing   Problem: Respiratory: Goal: Respiratory status will improve Outcome: Progressing   Problem: Clinical Measurements: Goal: Diagnostic test results will improve Outcome: Not Progressing Note: Potassium was low and required 5 runs of IV potassium Goal: Respiratory complications will improve Outcome: Progressing Goal: Cardiovascular complication will be avoided Outcome: Progressing   Problem: Activity: Goal: Risk for activity intolerance will decrease Outcome: Progressing   Problem: Coping: Goal: Level of anxiety will decrease Outcome: Progressing   Problem: Elimination: Goal: Will not experience complications related to urinary retention Outcome: Progressing   Problem: Pain Management: Goal: General experience of comfort will improve Outcome: Progressing

## 2023-10-27 DIAGNOSIS — D62 Acute posthemorrhagic anemia: Secondary | ICD-10-CM | POA: Insufficient documentation

## 2023-10-27 DIAGNOSIS — E876 Hypokalemia: Secondary | ICD-10-CM | POA: Insufficient documentation

## 2023-10-27 LAB — CREATININE, SERUM
Creatinine, Ser: 0.6 mg/dL (ref 0.44–1.00)
GFR, Estimated: >60 mL/min (ref >60)

## 2023-10-27 LAB — CBC
HCT: 23 % — ABNORMAL LOW (ref 36.0–46.0)
Hemoglobin: 8.1 g/dL — ABNORMAL LOW (ref 12.0–15.0)
MCH: 31.3 pg (ref 26.0–34.0)
MCHC: 35.2 g/dL (ref 30.0–36.0)
MCV: 88.8 fL (ref 80.0–100.0)
Platelets: 153 10*3/uL (ref 150–400)
RBC: 2.59 MIL/uL — ABNORMAL LOW (ref 3.87–5.11)
RDW: 13.7 % (ref 11.5–15.5)
WBC: 7.1 10*3/uL (ref 4.0–10.5)
nRBC: 0.3 % — ABNORMAL HIGH (ref 0.0–0.2)

## 2023-10-27 LAB — POTASSIUM: Potassium: 3.2 mmol/L — ABNORMAL LOW (ref 3.5–5.1)

## 2023-10-27 LAB — MAGNESIUM: Magnesium: 2.2 mg/dL (ref 1.7–2.4)

## 2023-10-27 LAB — SURGICAL PATHOLOGY

## 2023-10-27 MED ORDER — BISMUTH SUBSALICYLATE 262 MG/15ML PO SUSP: 30.0000 mL | Freq: Three times a day (TID) | ORAL | Status: DC | PRN

## 2023-10-27 MED ORDER — TAB-A-VITE/IRON PO TABS: 1.0000 | ORAL_TABLET | Freq: Every day | ORAL | Status: DC

## 2023-10-27 MED ORDER — ENOXAPARIN SODIUM 40 MG/0.4ML IJ SOSY
40.0000 mg | PREFILLED_SYRINGE | INTRAMUSCULAR | Status: DC
  Administered 2023-10-27: 40 mg via SUBCUTANEOUS
  Filled 2023-10-27: qty 0.4

## 2023-10-27 MED ORDER — POTASSIUM CHLORIDE 10 MEQ/100ML IV SOLN
10.0000 meq | INTRAVENOUS | Status: AC
  Administered 2023-10-27 (×3): 10 meq via INTRAVENOUS
  Filled 2023-10-27 (×4): qty 100

## 2023-10-27 MED ORDER — ORAL CARE MOUTH RINSE: 15.0000 mL | OROMUCOSAL | Status: DC | PRN

## 2023-10-27 NOTE — Progress Notes (Signed)
Occupational Therapy Treatment Patient Details Name: Kylie Casey MRN: [id number] DOB: 06-03-1946 Today's Date: 10/27/2023   History of present illness Patient is a 77 year old female who presented to the hospital for takedown of colovaginal fistula. Patient had an episode of syncope secondary to hypotension post op with transition to SDU. PMH: prediabetes.   OT comments  Patient was motivated to participate in session. Patient was able to progress to bathroom with no AD and no overt LOB mild unsteadiness noted on way to bathroom with no present on way back to bed. Patient's discharge plan remains appropriate at this time. OT will continue to follow acutely.        If plan is discharge home, recommend the following:  A little help with walking and/or transfers;A little help with bathing/dressing/bathroom;Assistance with cooking/housework;Direct supervision/assist for medications management;Assist for transportation;Help with stairs or ramp for entrance;Direct supervision/assist for financial management   Equipment Recommendations  None recommended by OT       Precautions / Restrictions Precautions Precautions: Fall Restrictions Weight Bearing Restrictions: No       Mobility Bed Mobility Overal bed mobility: Needs Assistance Bed Mobility: Supine to Sit     Supine to sit: Modified independent (Device/Increase time)                     ADL either performed or assessed with clinical judgement   ADL Overall ADL's : Needs assistance/impaired     Grooming: Standing;Wash/dry hands;Supervision/safety Grooming Details (indicate cue type and reason): at sink in bathroom             Lower Body Dressing: Contact guard assist;Sit to/from stand;Sitting/lateral leans   Toilet Transfer: Contact guard assist;Ambulation Toilet Transfer Details (indicate cue type and reason): with VSS. with no AD with some unsteadiness. Toileting- Clothing Manipulation and Hygiene: Contact  guard assist;Supervision/safety;Sitting/lateral lean;Set up Toileting - Clothing Manipulation Details (indicate cue type and reason): with increased time              Cognition Arousal: Alert Behavior During Therapy: WFL for tasks assessed/performed Overall Cognitive Status: Within Functional Limits for tasks assessed       General Comments: plesant and cooperative                   Pertinent Vitals/ Pain       Pain Assessment Pain Assessment: No/denies pain         Frequency  Min 1X/week        Progress Toward Goals  OT Goals(current goals can now be found in the care plan section)  Progress towards OT goals: Progressing toward goals     Plan         AM-PAC OT "6 Clicks" Daily Activity     Outcome Measure   Help from another person eating meals?: None Help from another person taking care of personal grooming?: A Little Help from another person toileting, which includes using toliet, bedpan, or urinal?: A Little Help from another person bathing (including washing, rinsing, drying)?: A Little Help from another person to put on and taking off regular upper body clothing?: None Help from another person to put on and taking off regular lower body clothing?: A Little 6 Click Score: 20    End of Session Equipment Utilized During Treatment: Gait belt;Rolling walker (2 wheels)  OT Visit Diagnosis: Unsteadiness on feet (R26.81);Other abnormalities of gait and mobility (R26.89);Muscle weakness (generalized) (M62.81)   Activity Tolerance Patient tolerated treatment well  Patient Left in chair;with call bell/phone within reach   Nurse Communication Mobility status        Time: 1610-9604 OT Time Calculation (min): 18 min  Charges: OT General Charges $OT Visit: 1 Visit OT Treatments $Self Care/Home Management : 8-22 mins  Rosalio Loud, MS Acute Rehabilitation Department Office# [phone]   Selinda Flavin 10/27/2023, 1:32 PM

## 2023-10-27 NOTE — Plan of Care (Signed)
  Problem: Education: Goal: Understanding of discharge needs will improve Outcome: Progressing Goal: Verbalization of understanding of the causes of altered bowel function will improve Outcome: Progressing

## 2023-10-27 NOTE — Progress Notes (Signed)
Physical Therapy Treatment Patient Details Name: Kylie Casey MRN: [id number] DOB: Apr 26, 1946 Today's Date: 10/27/2023   History of Present Illness Patient is a 77 year old female who presented to the hospital for takedown of colovaginal fistula. Patient had an episode of syncope secondary to hypotension post op with transition to SDU. PMH: prediabetes.    PT Comments  Pt continues to progress steadily. Amb 90' today with no device and CGA for safety, HR max 124 with stair training which pt tol well. Continue to follow in acute setting, likely can sign off next session if pt continues to progress and meet goals.    If plan is discharge home, recommend the following:     Can travel by private vehicle        Equipment Recommendations  None recommended by PT    Recommendations for Other Services       Precautions / Restrictions Precautions Precautions: Fall Precaution Comments: monitor HR Restrictions Weight Bearing Restrictions: No     Mobility  Bed Mobility Overal bed mobility: Needs Assistance Bed Mobility: Supine to Sit, Sit to Supine     Supine to sit: Modified independent (Device/Increase time)     General bed mobility comments: with increased time    Transfers Overall transfer level: Needs assistance Equipment used: Rolling walker (2 wheels) Transfers: Sit to/from Stand Sit to Stand: Contact guard assist, Supervision           General transfer comment: for safety    Ambulation/Gait Ambulation/Gait assistance: Contact guard assist, Supervision Gait Distance (Feet): 370 Feet Assistive device: Rolling walker (2 wheels) Gait Pattern/deviations: WFL(Within Functional Limits)       General Gait Details: supervision to CGA for safety. no LOB   Stairs Stairs: Yes Stairs assistance: Supervision Stair Management: Two rails, Forwards, Alternating pattern Number of Stairs: 10 General stair comments: supervision for safety   Wheelchair Mobility      Tilt Bed    Modified Rankin (Stroke Patients Only)       Balance Overall balance assessment: Needs assistance Sitting-balance support: No upper extremity supported, Feet supported Sitting balance-Leahy Scale: Good     Standing balance support: No upper extremity supported Standing balance-Leahy Scale: Fair Standing balance comment: Fair to good             High level balance activites: Side stepping, Backward walking, Turns, Head turns High Level Balance Comments: no LOB            Cognition Arousal: Alert Behavior During Therapy: WFL for tasks assessed/performed Overall Cognitive Status: Within Functional Limits for tasks assessed                                 General Comments: plesant and cooperative        Exercises      General Comments        Pertinent Vitals/Pain      Home Living                          Prior Function            PT Goals (current goals can now be found in the care plan section) Acute Rehab PT Goals Patient Stated Goal: Regain IND and return home PT Goal Formulation: With patient Time For Goal Achievement: 11/09/23 Potential to Achieve Goals: Good Progress towards PT goals: Progressing toward goals    Frequency  Min 1X/week      PT Plan      Co-evaluation PT/OT/SLP Co-Evaluation/Treatment: Yes            AM-PAC PT "6 Clicks" Mobility   Outcome Measure  Help needed turning from your back to your side while in a flat bed without using bedrails?: None Help needed moving from lying on your back to sitting on the side of a flat bed without using bedrails?: None Help needed moving to and from a bed to a chair (including a wheelchair)?: None Help needed standing up from a chair using your arms (e.g., wheelchair or bedside chair)?: None Help needed to walk in hospital room?: A Little Help needed climbing 3-5 steps with a railing? : A Little 6 Click Score: 22    End of Session  Equipment Utilized During Treatment: Gait belt Activity Tolerance: Patient tolerated treatment well Patient left: with call bell/phone within reach;in bed;with family/visitor present Nurse Communication: Mobility status PT Visit Diagnosis: Unsteadiness on feet (R26.81);Muscle weakness (generalized) (M62.81)     Time: 1610-9604 PT Time Calculation (min) (ACUTE ONLY): 21 min  Charges:    $Gait Training: 8-22 mins PT General Charges $$ ACUTE PT VISIT: 1 Visit                     Bashar Milam, PT  Acute Rehab Dept [facility]) [phone]  10/27/2023    [facility] 10/27/2023, 3:16 PM

## 2023-10-27 NOTE — Plan of Care (Signed)
  Problem: Activity: Goal: Ability to tolerate increased activity will improve Outcome: Progressing   Problem: Bowel/Gastric: Goal: Gastrointestinal status for postoperative course will improve Outcome: Progressing   Problem: Nutritional: Goal: Will attain and maintain optimal nutritional status will improve Outcome: Progressing   Problem: Clinical Measurements: Goal: Postoperative complications will be avoided or minimized Outcome: Progressing   Problem: Respiratory: Goal: Respiratory status will improve Outcome: Progressing   Problem: Skin Integrity: Goal: Will show signs of wound healing Outcome: Progressing   Problem: Health Behavior/Discharge Planning: Goal: Ability to manage health-related needs will improve Outcome: Progressing   Problem: Clinical Measurements: Goal: Ability to maintain clinical measurements within normal limits will improve Outcome: Progressing Goal: Diagnostic test results will improve Outcome: Progressing Goal: Respiratory complications will improve Outcome: Progressing   Problem: Activity: Goal: Risk for activity intolerance will decrease Outcome: Progressing   Problem: Nutrition: Goal: Adequate nutrition will be maintained Outcome: Progressing

## 2023-10-27 NOTE — Progress Notes (Signed)
Report called to [facility] RN, Dahlia Client. All questions answered at this time. All patient belongings and paper chart transported with patient. Patient was transferred, in the wheelchair, by RN. Bedside handoff completed.

## 2023-10-27 NOTE — Progress Notes (Addendum)
10/27/2023  Kylie Casey [id number] 10-13-1946  CARE TEAM: PCP: Gilmore Laroche, FNP  Outpatient Care Team: Patient Care Team: Gilmore Laroche, FNP as PCP - General (Family Medicine) Karie Soda, MD as Consulting Physician (General Surgery) Lanelle Bal, DO as Consulting Physician (Gastroenterology)  Inpatient Treatment Team: Treatment Team:  Karie Soda, MD Hughie Closs, MD Wonda Cheng, RN Purgason, Wayne Both, RRT Pricilla Riffle, [facility] [name], RN   Problem List:   Principal Problem:   Colovaginal fistula Active Problems:   Prediabetes   History of adenomatous polyp of colon   Syncope   Vitamin D deficiency   Hypokalemia   Acute blood loss anemia (ABLA)   10/23/2023  POST-OPERATIVE DIAGNOSIS:   COLOVAGINAL FISTULA OVARIAN CYST - LEFT   PROCEDURE:   ROBOTIC LOW ANTERIOR RECTOSIGMOID RESECTION TAKEDOWN & REPAIR OF COLOVAGINAL FISTULA EN-BLOC LEFT SALPINGO-OOPHORECTOMY INTRAOPERATIVE ASSESSMENT OF TISSUE VASCULAR PERFUSION USING ICG (indocyanine green) IMMUNOFLUORESCENCE TRANSVERSUS ABDOMINIS PLANE (TAP) BLOCK - BILATERAL FLEXIBLE SIGMOIDOSCOPY   SURGEON:  Ardeth Sportsman, MD  OR FINDINGS:   Patient had thickened inflamed colon with dense adhesions of rectosigmoid to left lateral vaginal cuff and left adnexa with enlarged but simple ovarian cyst.  En bloc left salpingo-oophorectomy done.  Resection needed to go down to mid rectum for good margins.  Primary repair of colovaginal fistula done   No obvious metastatic disease on visceral parietal peritoneum or liver.   It is a 29mm EEA anastomosis ( distal descending colon  connected to mid rectum.)  It rests 7 cm from the anal verge by rigid proctoscopy.    Assessment [facility] Stay = 4 days) 4 Days Post-Op    Ileus resolving.      Plan:  -ERAS pathway  -Advance to solid diet.  Syncopal event most likely vasovagal with laxative any cardiac or neurologic issues.  Check  orthostatics and follow.  Medicine help appreciated.  Transfer to floor.  DC telemetry.  Acute blood loss anemia in the setting of chronic disease with chronic diverticulitis and colovaginal fistula.  Appears to be stable.  Received IV iron.  Will give oral iron as well.  Fiber bowel regimen with fiber twice daily.  Diet.  Follow-up in pathology.  Most likely diverticulitis as etiology for colovaginal fistula.  Serial labs follow electrolytes.  Hypokalemia.  Magnesium seems okay.  Follow   -VTE prophylaxis- SCDs, etc. start enoxaparin and follow.  -mobilize as tolerated to help recovery.  Check orthostatics.  I updated the patient's status to the patient, her daughter in the room, stepdown nurse in the room.  Recommendations were made.  Questions were answered.  They expressed understanding & appreciation.   -Disposition:  Disposition:  The patient is from: Home Anticipate discharge to:  Home Anticipated Date of Discharge is:  November 13,2024   Barriers to discharge:  Consultant clearance & sign off  , Testing result pending, Need for inpatient procedure/study, and Pending Clinical improvement (more likely than not)  Patient currently is NOT MEDICALLY STABLE for discharge from the hospital from a surgery standpoint.      I reviewed nursing notes, last 24 h vitals and pain scores, last 48 h intake and output, last 24 h labs and trends, and last 24 h imaging results.  I have reviewed this patient's available data, including medical history, events of note, test results, etc as part of my evaluation.   A significant portion of that time was spent in counseling. Care during the described time interval was  provided by me.  This care required moderate level of medical decision making.  10/27/2023    Subjective: (Chief complaint)  Patient remained in stepdown through the weekend.  No major events.  Daughter in room.  Nursing just came into room.  Patient denies any  pain.  Pleasant and chatty and joking.  Tolerated full liquids.  Diet not advanced.  Objective:  Vital signs:  Vitals:   10/27/23 0300 10/27/23 0400 10/27/23 0700 10/27/23 0754  BP: 110/62 136/77 131/77   Pulse: 77 89 86 82  Resp: 15 10 18 15   Temp:  (!) 97.4 F (36.3 C)    TempSrc:  Oral    SpO2: 95% 98% 97% 96%  Weight:  67.3 kg    Height:        Last BM Date : 10/27/23  Intake/Output   Yesterday:  11/10 0701 - 11/11 0700 In: 528.4 [P.O.:200; I.V.:81.4; IV Piggyback:247] Out: 1552 [Urine:1550; Stool:2] This shift:  Total I/O In: 1.6 [I.V.:1.6] Out: -   Bowel function:  Flatus: YES  BM:  YES  Drain: (No drain)   Physical Exam:  General: Pt awake/alert in no acute distress.  Smiling and joking.  Very chatty. Eyes: PERRL, normal EOM.  Sclera clear.  No icterus Neuro: CN II-XII intact w/o focal sensory/motor deficits. Lymph: No head/neck/groin lymphadenopathy Psych:  No delerium/psychosis/paranoia.  Oriented x 4 HENT: Normocephalic, Mucus membranes moist.  No thrush Neck: Supple, No tracheal deviation.  No obvious thyromegaly Chest: No pain to chest wall compression.  Good respiratory excursion.  No audible wheezing CV:  Pulses intact.  Regular rhythm.  No major extremity edema MS: Normal AROM mjr joints.  No obvious deformity  Abdomen: Soft.  Nondistended.  Nontender.  Dressings clean dry and intact.  I removed them.  Some ecchymosis at Pfannenstiel incision but no active bleeding.  No cellulitis or abscess.  BU.  Foley catheter out.  No major vaginal bleeding or discharge Ext:   No deformity.  No mjr edema.  No cyanosis Skin: No petechiae / purpurea.  No major sores.  Warm and dry    Results:   Cultures: Recent Results (from the past 720 hour(s))  MRSA Next Gen by PCR, Nasal     Status: None   Collection Time: 10/23/23  7:58 PM   Specimen: Nasal Mucosa; Nasal Swab  Result Value Ref Range Status   MRSA by PCR Next Gen NOT DETECTED NOT DETECTED  Final    Comment: (NOTE) The GeneXpert MRSA Assay (FDA approved for NASAL specimens only), is one component of a comprehensive MRSA colonization surveillance program. It is not intended to diagnose MRSA infection nor to guide or monitor treatment for MRSA infections. Test performance is not FDA approved in patients less than 88 years old. Performed at [facility], [address] [place], [place] [address]     Labs: Results for orders placed or performed during the hospital encounter of 10/23/23 (from the past 48 hour(s))  CBC     Status: Abnormal   Collection Time: 10/25/23  3:40 PM  Result Value Ref Range   WBC 8.7 4.0 - 10.5 K/uL   RBC 2.82 (L) 3.87 - 5.11 MIL/uL   Hemoglobin 8.7 (L) 12.0 - 15.0 g/dL   HCT 60.4 (L) 54.0 - 98.1 %   MCV 88.3 80.0 - 100.0 fL   MCH 30.9 26.0 - 34.0 pg   MCHC 34.9 30.0 - 36.0 g/dL   RDW 19.1 47.8 - 29.5 %  Platelets 132 (L) 150 - 400 K/uL   nRBC 0.0 0.0 - 0.2 %    Comment: Performed at [facility], [address] [place], [place] [address]  CBC     Status: Abnormal   Collection Time: 10/26/23  3:10 AM  Result Value Ref Range   WBC 7.7 4.0 - 10.5 K/uL   RBC 2.64 (L) 3.87 - 5.11 MIL/uL   Hemoglobin 8.2 (L) 12.0 - 15.0 g/dL   HCT 60.4 (L) 54.0 - 98.1 %   MCV 88.6 80.0 - 100.0 fL   MCH 31.1 26.0 - 34.0 pg   MCHC 35.0 30.0 - 36.0 g/dL   RDW 19.1 47.8 - 29.5 %   Platelets 149 (L) 150 - 400 K/uL    Comment: SPECIMEN CHECKED FOR CLOTS REPEATED TO VERIFY    nRBC 0.0 0.0 - 0.2 %    Comment: Performed at [facility], [address], [place], [place] [address]  Potassium     Status: Abnormal   Collection Time: 10/26/23  3:10 AM  Result Value Ref Range   Potassium 2.9 (L) 3.5 - 5.1 mmol/L    Comment: Performed at [facility], [address], [place], [place] [address]  Creatinine, serum     Status: None   Collection Time: 10/26/23  3:10 AM  Result Value Ref Range    Creatinine, Ser 0.57 0.44 - 1.00 mg/dL   GFR, Estimated >46 >96 mL/min    Comment: (NOTE) Calculated using the CKD-EPI Creatinine Equation (2021) Performed at [facility], [address], [place], [place] [address]   Hemoglobin and hematocrit, blood     Status: Abnormal   Collection Time: 10/26/23  3:39 PM  Result Value Ref Range   Hemoglobin 8.6 (L) 12.0 - 15.0 g/dL   HCT 41.3 (L) 24.4 - 01.0 %    Comment: Performed at [facility], [address] [place], [place] [address]  CBC     Status: Abnormal   Collection Time: 10/27/23  3:13 AM  Result Value Ref Range   WBC 7.1 4.0 - 10.5 K/uL   RBC 2.59 (L) 3.87 - 5.11 MIL/uL   Hemoglobin 8.1 (L) 12.0 - 15.0 g/dL   HCT 66.4 (L) 40.3 - 47.4 %   MCV 88.8 80.0 - 100.0 fL   MCH 31.3 26.0 - 34.0 pg   MCHC 35.2 30.0 - 36.0 g/dL   RDW 25.9 56.3 - 87.5 %   Platelets 153 150 - 400 K/uL   nRBC 0.3 (H) 0.0 - 0.2 %    Comment: Performed at [facility], [address] [place], [place] [address]  Potassium     Status: Abnormal   Collection Time: 10/27/23  3:13 AM  Result Value Ref Range   Potassium 3.2 (L) 3.5 - 5.1 mmol/L    Comment: Performed at [facility], [address], [place], [place] [address]  Creatinine, serum     Status: None   Collection Time: 10/27/23  3:13 AM  Result Value Ref Range   Creatinine, Ser 0.60 0.44 - 1.00 mg/dL   GFR, Estimated >41 >66 mL/min    Comment: (NOTE) Calculated using the CKD-EPI Creatinine Equation (2021) Performed at [facility], [address] [place], [place] [address]     Imaging / Studies: No results found.  Medications / Allergies: per chart  Antibiotics: Anti-infectives (From admission, onward)    Start     Dose/Rate  Route Frequency Ordered Stop   10/23/23 1900  cefoTEtan (CEFOTAN) 2 g in sodium chloride 0.9 % 100 mL IVPB        2 g 200 mL/hr over 30 Minutes Intravenous Every 12 hours  10/23/23 1152 10/23/23 2108   10/23/23 1400  neomycin (MYCIFRADIN) tablet 1,000 mg  Status:  Discontinued       Placed in "And" Linked Group   1,000 mg Oral 3 times per day 10/23/23 0533 10/23/23 0541   10/23/23 1400  metroNIDAZOLE (FLAGYL) tablet 1,000 mg  Status:  Discontinued       Placed in "And" Linked Group   1,000 mg Oral 3 times per day 10/23/23 0533 10/23/23 0541   10/23/23 0600  cefoTEtan (CEFOTAN) 2 g in sodium chloride 0.9 % 100 mL IVPB        2 g 200 mL/hr over 30 Minutes Intravenous On call to O.R. 10/23/23 0533 10/23/23 1221         Note: Portions of this report may have been transcribed using voice recognition software. Every effort was made to ensure accuracy; however, inadvertent computerized transcription errors may be present.   Any transcriptional errors that result from this process are unintentional.    Ardeth Sportsman, MD, FACS, MASCRS Esophageal, Gastrointestinal & Colorectal Surgery Robotic and Minimally Invasive Surgery  [facility] [address], [address] [place], [place] [address] [phone] Fax [phone] Main  CONTACT INFORMATION: Weekday (9AM-5PM): Call [facility] main office at [phone] Weeknight (5PM-9AM) or Weekend/Holiday: Check EPIC "Web Links" tab & use "AMION" (password " [email or web address]") for General Surgery [facility] coverage  Please, DO NOT use SecureChat  (it is not reliable communication to reach operating surgeons & will lead to a delay in care).   Epic staff messaging available for outptient concerns needing 1-2 business day response.      10/27/2023  8:22 AM

## 2023-10-28 LAB — POTASSIUM: Potassium: 3.5 mmol/L (ref 3.5–5.1)

## 2023-10-28 LAB — MAGNESIUM: Magnesium: 2.1 mg/dL (ref 1.7–2.4)

## 2023-10-28 LAB — HEMOGLOBIN: Hemoglobin: 9.2 g/dL — ABNORMAL LOW (ref 12.0–15.0)

## 2023-10-28 NOTE — Discharge Summary (Signed)
Physician Discharge Summary    Kylie Casey MRN: [id number] DOB/AGE: Aug 12, 1946 = 77 y.o.  Patient Care Team: Gilmore Laroche, FNP as PCP - General (Family Medicine) Karie Soda, MD as Consulting Physician (General Surgery) Lanelle Bal, DO as Consulting Physician (Gastroenterology)  Admit date: 10/23/2023  Discharge date: 10/28/2023  Hospital Stay = 5 days    Discharge Diagnoses:  Principal Problem:   Colovaginal fistula Active Problems:   Prediabetes   History of adenomatous polyp of colon   Syncope   Vitamin D deficiency   Hypokalemia   Acute blood loss anemia (ABLA)   5 Days Post-Op  10/23/2023  POST-OPERATIVE DIAGNOSIS:   COLOVAGINAL FISTULA  SURGERY:  10/23/2023  Procedure(s): ROBOTIC RESECTION OF COLON RECTOSIGMOID CYSTOSCOPY with FIREFLY INJECTION  SURGEON:    Surgeon(s): Crist Fat, MD Karie Soda, MD Romie Levee, MD  Consults: Case Management / Social Work, Physical Therapy, Occupational Therapy, Pharmacy, Nutrition, Anesthesia, and Internal Medicine [facility])  Hospital Course:   The patient underwent the surgery above.  Patient had a moment of syncope but quickly awoke.  Was monitored stepdown unit for several days with no telemetry events.  Internal medicine was consulted and workup negative for any cardiac or pulmonary issues and seem to be vasovagal.  She did have some postoperative anemia but it stabilized.  Received IV iron and iron and multivitamin.  Improved.  Transfer to floor.  Postoperatively, the patient gradually mobilized and advanced to a solid diet.  Pain and other symptoms were treated aggressively.    By the time of discharge, the patient was walking well the hallways, eating food, having flatus.  Pain was well-controlled on an oral medications.  Based on meeting discharge criteria and continuing to recover, I felt it was safe for the patient to be discharged from the hospital to further recover with close  followup. Postoperative recommendations were discussed in detail.  They are written as well.  Discharged Condition: good  Discharge Exam: Blood pressure 116/77, pulse 91, temperature 97.9 F (36.6 C), resp. rate 15, height 5\' 1"  (1.549 m), weight 64.8 kg, SpO2 99%.  General: Pt awake/alert/oriented x4 in No acute distress Eyes: PERRL, normal EOM.  Sclera clear.  No icterus Neuro: CN II-XII intact w/o focal sensory/motor deficits. Lymph: No head/neck/groin lymphadenopathy Psych:  No delerium/psychosis/paranoia HENT: Normocephalic, Mucus membranes moist.  No thrush Neck: Supple, No tracheal deviation Chest:  No chest wall pain w good excursion CV:  Pulses intact.  Regular rhythm MS: Normal AROM mjr joints.  No obvious deformity Abdomen: Soft.  Nondistended.  Nontender.  No evidence of peritonitis.  No incarcerated hernias. Ext:  SCDs BLE.  No mjr edema.  No cyanosis Skin: No petechiae / purpura   Disposition:    Follow-up Information     Karie Soda, MD Follow up on 11/10/2023.   Specialties: General Surgery, Colon and Rectal Surgery Why: To follow up after your hospital stay Contact information: [address] [place] [address] [phone]                 Discharge disposition: 01-Home or Self Care       Discharge Instructions     Call MD for:   Complete by: As directed    FEVER > 101.5 F  (temperatures < 101.5 F are not significant)   Call MD for:  extreme fatigue   Complete by: As directed    Call MD for:  persistant dizziness or light-headedness   Complete by:  As directed    Call MD for:  persistant nausea and vomiting   Complete by: As directed    Call MD for:  redness, tenderness, or signs of infection (pain, swelling, redness, odor or green/yellow discharge around incision site)   Complete by: As directed    Call MD for:  severe uncontrolled pain   Complete by: As directed    Diet - low sodium heart healthy   Complete by: As  directed    Start with a bland diet such as soups, liquids, starchy foods, low fat foods, etc. the first few days at home. Gradually advance to a solid, low-fat, high fiber diet by the end of the first week at home.   Add a fiber supplement to your diet (Metamucil, etc) If you feel full, bloated, or constipated, stay on a full liquid or pureed/blenderized diet for a few days until you feel better and are no longer constipated.   Discharge instructions   Complete by: As directed    See Discharge Instructions If you are not getting better after two weeks or are noticing you are getting worse, contact our office [phone] for further advice.  We may need to adjust your medications, re-evaluate you in the office, send you to the emergency room, or see what other things we can do to help. The clinic staff is available to answer your questions during regular business hours (8:30am-5pm).  Please don't hesitate to call and ask to speak to one of our nurses for clinical concerns.    A surgeon from [facility] is always on call at the hospitals 24 hours/day If you have a medical emergency, go to the nearest emergency room or call 911.   Discharge wound care:   Complete by: As directed    It is good for closed incisions and even open wounds to be washed every day.  Shower every day.  Short baths are fine.  Wash the incisions and wounds clean with soap & water.    You may leave closed incisions open to air if it is dry.   You may cover the incision with clean gauze & replace it after your daily shower for comfort.  TEGADERM:  You have clear gauze band-aid dressings over your closed incision(s).  Remove the dressings 2 days after surgery = Saturday 10/25/2023   Driving Restrictions   Complete by: As directed    You may drive when: - you are no longer taking narcotic prescription pain medication - you can comfortably wear a seatbelt - you can safely make sudden turns/stops without pain.    Increase activity slowly   Complete by: As directed    Start light daily activities --- self-care, walking, climbing stairs- beginning the day after surgery.  Gradually increase activities as tolerated.  Control your pain to be active.  Stop when you are tired.  Ideally, walk several times a day, eventually an hour a day.   Most people are back to most day-to-day activities in a few weeks.  It takes 4-6 weeks to get back to unrestricted, intense activity. If you can walk 30 minutes without difficulty, it is safe to try more intense activity such as jogging, treadmill, bicycling, low-impact aerobics, swimming, etc. Save the most intensive and strenuous activity for last (Usually 4-8 weeks after surgery) such as sit-ups, heavy lifting, contact sports, etc.  Refrain from any intense heavy lifting or straining until you are off narcotics for pain control.  You will have off  days, but things should improve week-by-week. DO NOT PUSH THROUGH PAIN.  Let pain be your guide: If it hurts to do something, don't do it.   Lifting restrictions   Complete by: As directed    If you can walk 30 minutes without difficulty, it is safe to try more intense activity such as jogging, treadmill, bicycling, low-impact aerobics, swimming, etc. Save the most intensive and strenuous activity for last (Usually 4-8 weeks after surgery) such as sit-ups, heavy lifting, contact sports, etc.   Refrain from any intense heavy lifting or straining until you are off narcotics for pain control.  You will have off days, but things should improve week-by-week. DO NOT PUSH THROUGH PAIN.  Let pain be your guide: If it hurts to do something, don't do it.  Pain is your body warning you to avoid that activity for another week until the pain goes down.   May shower / Bathe   Complete by: As directed    May walk up steps   Complete by: As directed    Sexual Activity Restrictions   Complete by: As directed    You may have sexual intercourse when  it is comfortable. If it hurts to do something, stop.       Allergies as of 10/28/2023   No Known Allergies      Medication List     TAKE these medications    CALCIUM PO Take 1 tablet by mouth in the morning and at bedtime.   cholecalciferol 25 MCG (1000 UNIT) tablet Commonly known as: VITAMIN D3 Take 1,000 Units by mouth daily.   Cod Liver Oil Caps Take 1 capsule by mouth daily.   FLAXSEED OIL PO Take 5 mLs by mouth 2 (two) times a week.   multivitamin with minerals Tabs tablet Take 1 tablet by mouth daily.   risedronate 35 MG tablet Commonly known as: Actonel Take 1 tablet (35 mg total) by mouth every 7 (seven) days. with water on empty stomach, nothing by mouth or lie down for next 30 minutes.   vitamin E 180 MG (400 UNITS) capsule Take 400 Units by mouth daily.               Discharge Care Instructions  (From admission, onward)           Start     Ordered   10/23/23 0000  Discharge wound care:       Comments: It is good for closed incisions and even open wounds to be washed every day.  Shower every day.  Short baths are fine.  Wash the incisions and wounds clean with soap & water.    You may leave closed incisions open to air if it is dry.   You may cover the incision with clean gauze & replace it after your daily shower for comfort.  TEGADERM:  You have clear gauze band-aid dressings over your closed incision(s).  Remove the dressings 2 days after surgery = Saturday 10/25/2023   10/23/23 1051            Significant Diagnostic Studies:  Results for orders placed or performed during the hospital encounter of 10/23/23 (from the past 72 hour(s))  CBC     Status: Abnormal   Collection Time: 10/25/23  3:40 PM  Result Value Ref Range   WBC 8.7 4.0 - 10.5 K/uL   RBC 2.82 (L) 3.87 - 5.11 MIL/uL   Hemoglobin 8.7 (L) 12.0 - 15.0 g/dL   HCT 16.1 (L)  36.0 - 46.0 %   MCV 88.3 80.0 - 100.0 fL   MCH 30.9 26.0 - 34.0 pg   MCHC 34.9 30.0 - 36.0 g/dL    RDW 54.2 70.6 - 23.7 %   Platelets 132 (L) 150 - 400 K/uL   nRBC 0.0 0.0 - 0.2 %    Comment: Performed at [facility], [address] [place], [place] [address]  CBC     Status: Abnormal   Collection Time: 10/26/23  3:10 AM  Result Value Ref Range   WBC 7.7 4.0 - 10.5 K/uL   RBC 2.64 (L) 3.87 - 5.11 MIL/uL   Hemoglobin 8.2 (L) 12.0 - 15.0 g/dL   HCT 51.7 (L) 61.6 - 07.3 %   MCV 88.6 80.0 - 100.0 fL   MCH 31.1 26.0 - 34.0 pg   MCHC 35.0 30.0 - 36.0 g/dL   RDW 71.0 62.6 - 94.8 %   Platelets 149 (L) 150 - 400 K/uL    Comment: SPECIMEN CHECKED FOR CLOTS REPEATED TO VERIFY    nRBC 0.0 0.0 - 0.2 %    Comment: Performed at [facility], [address], [place], [place] [address]  Potassium     Status: Abnormal   Collection Time: 10/26/23  3:10 AM  Result Value Ref Range   Potassium 2.9 (L) 3.5 - 5.1 mmol/L    Comment: Performed at [facility], [address], [place], [place] [address]  Creatinine, serum     Status: None   Collection Time: 10/26/23  3:10 AM  Result Value Ref Range   Creatinine, Ser 0.57 0.44 - 1.00 mg/dL   GFR, Estimated >93 >81 mL/min    Comment: (NOTE) Calculated using the CKD-EPI Creatinine Equation (2021) Performed at [facility], [address] [place], [place] [address]   Hemoglobin and hematocrit, blood     Status: Abnormal   Collection Time: 10/26/23  3:39 PM  Result Value Ref Range   Hemoglobin 8.6 (L) 12.0 - 15.0 g/dL   HCT 71.6 (L) 96.7 - 89.3 %    Comment: Performed at [facility], [address], [place], [place] [address]  CBC     Status: Abnormal   Collection Time: 10/27/23  3:13 AM  Result Value Ref Range   WBC 7.1 4.0 - 10.5 K/uL   RBC 2.59 (L) 3.87 - 5.11 MIL/uL   Hemoglobin 8.1 (L) 12.0 - 15.0 g/dL   HCT 51.0 (L) 25.8 - 52.7 %   MCV 88.8 80.0 - 100.0 fL   MCH 31.3 26.0 - 34.0 pg   MCHC 35.2 30.0 - 36.0 g/dL   RDW 78.2 42.3 - 53.6 %    Platelets 153 150 - 400 K/uL   nRBC 0.3 (H) 0.0 - 0.2 %    Comment: Performed at [facility], [address] [place], [place] [address]  Potassium     Status: Abnormal   Collection Time: 10/27/23  3:13 AM  Result Value Ref Range   Potassium 3.2 (L) 3.5 - 5.1 mmol/L    Comment: Performed at [facility], [address], [place], [place] [address]  Creatinine, serum     Status: None   Collection Time: 10/27/23  3:13 AM  Result Value Ref Range   Creatinine, Ser 0.60 0.44 - 1.00 mg/dL   GFR, Estimated >67 >61 mL/min    Comment: (NOTE) Calculated using the CKD-EPI Creatinine Equation (2021) Performed at [facility], [address] [place], [place] [address]   Magnesium     Status: None   Collection Time: 10/27/23  3:13 AM  Result Value Ref Range   Magnesium 2.2 1.7 - 2.4 mg/dL    Comment: Performed at [facility], [address] [place], [place] [address]  Hemoglobin     Status: Abnormal   Collection Time: 10/28/23  4:40 AM  Result Value Ref Range   Hemoglobin 9.2 (L) 12.0 - 15.0 g/dL    Comment: Performed at [facility], [address], [place], [place] [address]  Potassium     Status: None   Collection Time: 10/28/23  4:40 AM  Result Value Ref Range   Potassium 3.5 3.5 - 5.1 mmol/L    Comment: Performed at [facility], [address] [place], [place] [address]  Magnesium     Status: None   Collection Time: 10/28/23  4:40 AM  Result Value Ref Range   Magnesium 2.1 1.7 - 2.4 mg/dL    Comment: Performed at [facility], [address], [place], [place] [address]    No results found.  Past Medical History:  Diagnosis Date   Osteoporosis    Pre-diabetes    Vitamin D deficiency     Past Surgical History:  Procedure Laterality Date   ABDOMINAL HYSTERECTOMY     COLONOSCOPY     COLONOSCOPY WITH PROPOFOL N/A 07/22/2023   Procedure:  COLONOSCOPY WITH PROPOFOL;  Surgeon: Lanelle Bal, DO;  [facility];  Service: Endoscopy;  Laterality: N/A;  12:15pm, asa 2   cyst removed      Social History   Socioeconomic History   Marital status: Single    Spouse name: Not on file   Number of children: 2   Years of education: Not on file   Highest education level: Not on file  Occupational History   Occupation: retired  Tobacco Use   Smoking status: Never   Smokeless tobacco: Never  Vaping Use   Vaping status: Never Used  Substance and Sexual Activity   Alcohol use: Never   Drug use: Never   Sexual activity: Not on file  Other Topics Concern   Not on file  Social History Narrative   Never married, 2 daughters   Social Determinants of Health   Financial Resource Strain: Low Risk  (10/15/2023)   Overall Financial Resource Strain (CARDIA)    Difficulty of Paying Living Expenses: Not hard at all  Food Insecurity: Patient Declined (10/23/2023)   Hunger Vital Sign    Worried About Running Out of Food in the Last Year: Patient declined    Ran Out of Food in the Last Year: Patient declined  Transportation Needs: Patient Declined (10/23/2023)   PRAPARE - Administrator, Civil Service (Medical): Patient declined    Lack of Transportation (Non-Medical): Patient declined  Physical Activity: Insufficiently Active (10/15/2023)   Exercise Vital Sign    Days of Exercise per Week: 2 days    Minutes of Exercise per Session: 30 min  Stress: No Stress Concern Present (10/15/2023)   [employer] of Occupational Health - Occupational Stress Questionnaire    Feeling of Stress : Only a little  Social Connections: Socially Isolated (10/15/2023)   Social Connection and Isolation Panel [NHANES]    Frequency of Communication with Friends and Family: More than three times a week    Frequency of Social Gatherings with Friends and Family: More  than three times a week    Attends Religious Services: Never     Active Member of Clubs or Organizations: No    Attends Banker Meetings: Never    Marital Status: Never married  Intimate Partner Violence: Patient Declined (10/23/2023)   Humiliation, Afraid, Rape, and Kick questionnaire    Fear of Current or Ex-Partner: Patient declined    Emotionally Abused: Patient declined    Physically Abused: Patient declined    Sexually Abused: Patient declined    Family History  Problem Relation Age of Onset   Stroke Mother    Stroke Father    Hypertension Father    Colon cancer Neg Hx     Current Facility-Administered Medications  Medication Dose Route Frequency Provider Last Rate Last Admin   0.9 %  sodium chloride infusion  250 mL Intravenous PRN Karie Soda, MD   Stopped at 10/27/23 0537   acetaminophen (TYLENOL) tablet 1,000 mg  1,000 mg Oral Trecia Rogers, MD   1,000 mg at 10/28/23 0522   alum & mag hydroxide-simeth (MAALOX/MYLANTA) 200-200-20 MG/5ML suspension 30 mL  30 mL Oral Q6H PRN Karie Soda, MD       bismuth subsalicylate (PEPTO BISMOL) 262 MG/15ML suspension 30 mL  30 mL Oral Q8H PRN Karie Soda, MD       Chlorhexidine Gluconate Cloth 2 % PADS 6 each  6 each Topical Daily Karie Soda, MD   6 each at 10/27/23 0900   cholecalciferol (VITAMIN D3) 25 MCG (1000 UNIT) tablet 1,000 Units  1,000 Units Oral Daily Karie Soda, MD   1,000 Units at 10/27/23 0901   enoxaparin (LOVENOX) injection 40 mg  40 mg Subcutaneous Q24H Karie Soda, MD   40 mg at 10/27/23 0901   hydrALAZINE (APRESOLINE) injection 10 mg  10 mg Intravenous Q2H PRN Karie Soda, MD       HYDROmorphone (DILAUDID) injection 0.5-2 mg  0.5-2 mg Intravenous Q4H PRN Karie Soda, MD       magic mouthwash  15 mL Oral QID PRN Karie Soda, MD       melatonin tablet 3 mg  3 mg Oral QHS PRN Karie Soda, MD       menthol-cetylpyridinium (CEPACOL) lozenge 3 mg  1 lozenge Oral PRN Karie Soda, MD       methocarbamol (ROBAXIN) injection 500 mg  500 mg Intravenous  Q6H PRN Karie Soda, MD       methocarbamol (ROBAXIN) tablet 500 mg  500 mg Oral Q6H PRN Karie Soda, MD       metoprolol tartrate (LOPRESSOR) injection 5 mg  5 mg Intravenous Q6H PRN Karie Soda, MD       multivitamin with minerals tablet 1 tablet  1 tablet Oral Daily Karie Soda, MD   1 tablet at 10/27/23 0902   naphazoline-glycerin (CLEAR EYES REDNESS) ophth solution 1-2 drop  1-2 drop Both Eyes QID PRN Karie Soda, MD       ondansetron (ZOFRAN) tablet 4 mg  4 mg Oral Q6H PRN Karie Soda, MD       Or   ondansetron (ZOFRAN) injection 4 mg  4 mg Intravenous Q6H PRN Karie Soda, MD       Oral care mouth rinse  15 mL Mouth Rinse 4 times per day Karie Soda, MD   15 mL at 10/27/23 2153   Oral care mouth rinse  15 mL Mouth Rinse PRN Karie Soda, MD       phenol (CHLORASEPTIC) mouth spray 2 spray  2 spray Mouth/Throat PRN Karie Soda, MD       polycarbophil (FIBERCON) tablet 625 mg  625 mg Oral BID Karie Soda, MD   625 mg at 10/27/23 2148   prochlorperazine (COMPAZINE) tablet 10 mg  10 mg Oral Q6H PRN Karie Soda, MD       Or   prochlorperazine (COMPAZINE) injection 5-10 mg  5-10 mg Intravenous Q6H PRN Karie Soda, MD       simethicone (MYLICON) chewable tablet 40 mg  40 mg Oral Q6H PRN Karie Soda, MD       sodium chloride (OCEAN) 0.65 % nasal spray 1-2 spray  1-2 spray Each Nare Q6H PRN Karie Soda, MD       sodium chloride flush (NS) 0.9 % injection 3 mL  3 mL Intravenous Catha Gosselin, MD   3 mL at 10/27/23 2150   sodium chloride flush (NS) 0.9 % injection 3 mL  3 mL Intravenous PRN Karie Soda, MD   3 mL at 10/26/23 0451   traMADol (ULTRAM) tablet 50-100 mg  50-100 mg Oral Q6H PRN Karie Soda, MD         No Known Allergies  Signed:   Ardeth Sportsman, MD, FACS, MASCRS Esophageal, Gastrointestinal & Colorectal Surgery Robotic and Minimally Invasive Surgery  [facility] [address], [address] [place], [place] [address] [phone] Fax [phone] Main  CONTACT INFORMATION: Weekday (9AM-5PM): Call [facility] main office at [phone] Weeknight (5PM-9AM) or Weekend/Holiday: Check EPIC "Web Links" tab & use "AMION" (password " [email or web address]") for General Surgery [facility] coverage  Please, DO NOT use SecureChat  (it is not reliable communication to reach operating surgeons & will lead to a delay in care).   Epic staff messaging available for outptient concerns needing 1-2 business day response.      10/28/2023, 7:59 AM

## 2023-10-29 ENCOUNTER — Telehealth: Payer: Self-pay

## 2023-10-29 NOTE — Transitions of Care (Post Inpatient/ED Visit) (Signed)
10/29/2023  Name: Kylie Casey Stinger MRN: [id number] DOB: 1946/06/11  Today's TOC FU Call Status: Today's TOC FU Call Status:: Successful TOC FU Call Completed TOC FU Call Complete Date: 10/29/23 Patient's Name and Date of Birth confirmed.  Transition Care Management Follow-up Telephone Call Date of Discharge: 10/28/23 Discharge Facility: [facility]) Type of Discharge: Inpatient Admission Primary Inpatient Discharge Diagnosis:: Colovaginal fistula How have you been since you were released from the hospital?: Better (Reports she is eating well. No dizziness.) Any questions or concerns?: No  Items Reviewed: Did you receive and understand the discharge instructions provided?: Yes Medications obtained,verified, and reconciled?: Yes (Medications Reviewed) Any new allergies since your discharge?: No Dietary orders reviewed?: Yes Type of Diet Ordered:: bland foods Do you have support at home?: Yes People in Home: child(ren), adult Name of Support/Comfort Primary Source: daughters  Medications Reviewed Today: Medications Reviewed Today     Reviewed by Earlie Server, RN (Registered Nurse) on 10/29/23 at [phone]  Med List Status: <None>   Medication Order Taking? Sig Documenting Provider Last Dose Status Informant  CALCIUM PO [id number] No Take 1 tablet by mouth in the morning and at bedtime.  Patient not taking: Reported on 10/29/2023   [provider] Not Taking Active Self  cholecalciferol (VITAMIN D3) 25 MCG (1000 UNIT) tablet [id number] No Take 1,000 Units by mouth daily.  Patient not taking: Reported on 10/29/2023   [provider] Not Taking Active Self  Cod Liver Oil CAPS [id number] No Take 1 capsule by mouth daily.  Patient not taking: Reported on 10/29/2023   [provider] Not Taking Active Self  Flaxseed, Linseed, (FLAXSEED OIL PO) [id number] No Take 5 mLs by mouth 2 (two) times a week.  Patient not taking: Reported on 10/29/2023   [provider] Not Taking Active Self           Med Note Maisie Fus, PAULA   Wed Oct 15, 2023 11:09 AM) Flaxseed powder, sprinkles a little on her food daily  Multiple Vitamin (MULTIVITAMIN WITH MINERALS) TABS tablet [id number] No Take 1 tablet by mouth daily.  Patient not taking: Reported on 10/29/2023   [provider] Not Taking Active Self  risedronate (ACTONEL) 35 MG tablet [id number] No Take 1 tablet (35 mg total) by mouth every 7 (seven) days. with water on empty stomach, nothing by mouth or lie down for next 30 minutes.  Patient not taking: Reported on 10/29/2023   Gilmore Laroche, FNP Not Taking Consider Medication Status and Discontinue Self  vitamin E 180 MG (400 UNITS) capsule [id number] No Take 400 Units by mouth daily.  Patient not taking: Reported on 10/29/2023   [provider] Not Taking Active Self            Home Care and Equipment/Supplies: Were Home Health Services Ordered?: NA Any new equipment or medical supplies ordered?: NA  Functional Questionnaire: Do you need assistance with bathing/showering or dressing?: No Do you need assistance with meal preparation?: No Do you need assistance with eating?: No Do you have difficulty maintaining continence: No Do you need assistance with getting out of bed/getting out of a chair/moving?: No Do you have difficulty managing or taking your medications?: No  Follow up appointments reviewed: PCP Follow-up appointment confirmed?: No (was not advised and reports she does not need to see PCP) MD Provider Line Number:[phone] Given: No Specialist Hospital Follow-up appointment confirmed?: Yes Date of Specialist follow-up appointment?: 11/10/23 Follow-Up Specialty Provider:: surgeon Do you need transportation  to your follow-up appointment?: No Do you understand care options if your condition(s) worsen?: Yes-patient verbalized understanding  SDOH Interventions Today    Flowsheet Row Most Recent Value   SDOH Interventions   Food Insecurity Interventions Intervention Not Indicated  Housing Interventions Intervention Not Indicated  Transportation Interventions Intervention Not Indicated  Utilities Interventions Intervention Not Indicated     Reviewed discharge instructions. Reviewed medications. Reviewed follow up appointments. Confirmed transportation. [employer] will send meals.  Offered 30 day TOC program and patient declined.  Lonia Chimera, RN, BSN, CEN [employer]- Transition of Care Team.  [facility] [phone]

## 2023-10-30 IMAGING — CT CT ABD-PELV W/ CM
2 of 5 series · 16 of 46 positions shown, 18 images · IV contrast (Omnipaque or Isovue)
Comparison: None.

CLINICAL DATA: Right lower quadrant abdominal pain.

EXAM:
CT ABDOMEN AND PELVIS WITH CONTRAST
TECHNIQUE: Multidetector CT imaging of the abdomen and pelvis was performed
using the standard protocol following bolus administration of
intravenous contrast.
CONTRAST:  100mL OMNIPAQUE IOHEXOL 300 MG/ML  SOLN

[Series 2: axial st · axial · 0.56mm/px · z∈[-159,+176]mm · 13 of 75 slices shown, 15 images]
[im 4/75  soft-tissue]
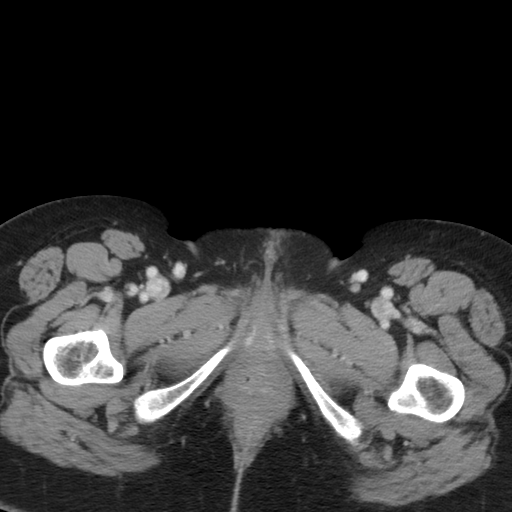
[im 4/75  bone]
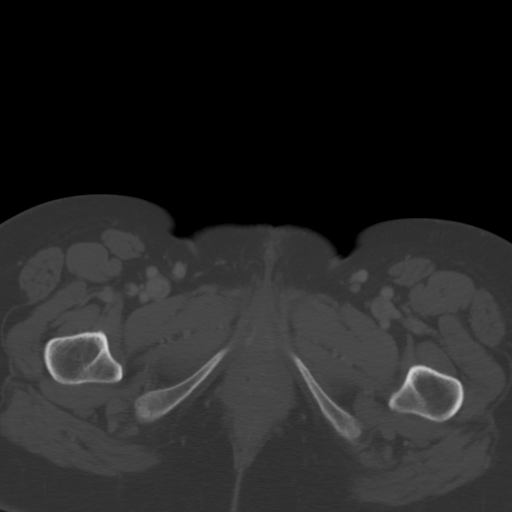
[im 12/75  soft-tissue]
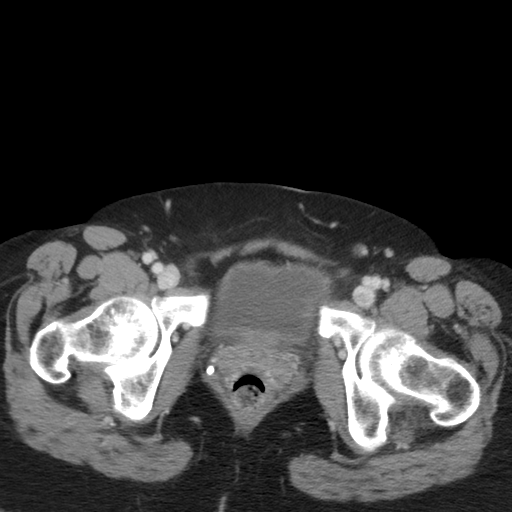
[im 16/75  soft-tissue]
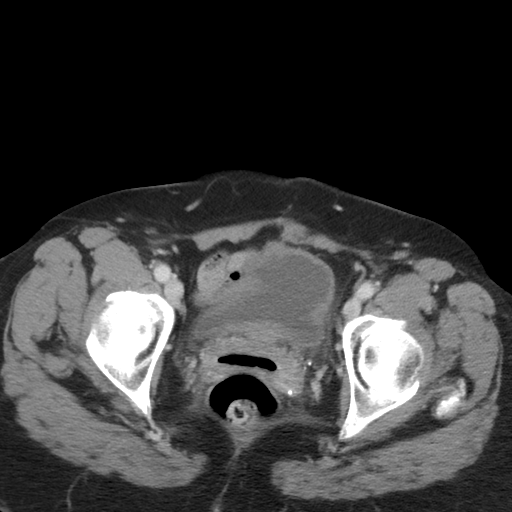
[im 20/75  soft-tissue]
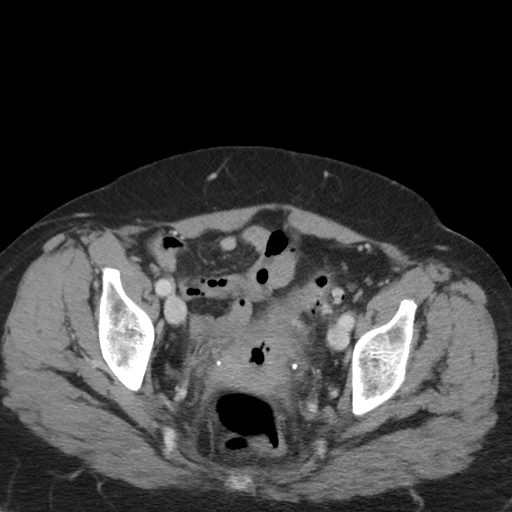
[im 28/75  soft-tissue]
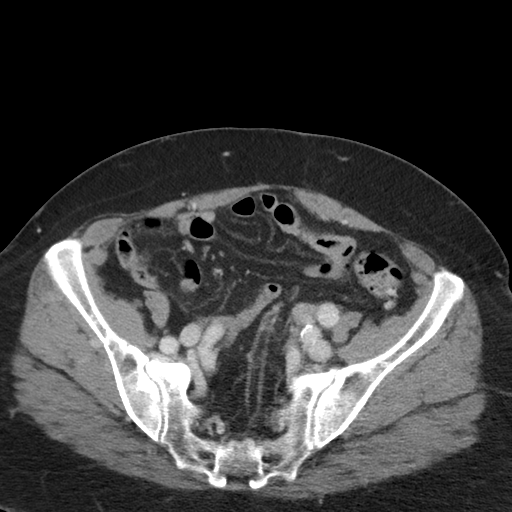
[im 32/75  soft-tissue]
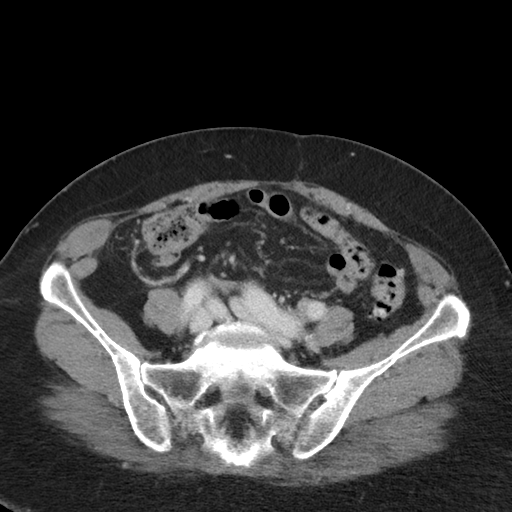
[im 39/75  soft-tissue]
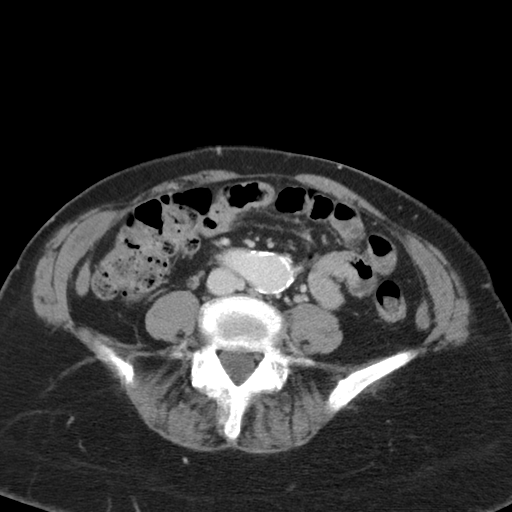
[im 43/75  soft-tissue]
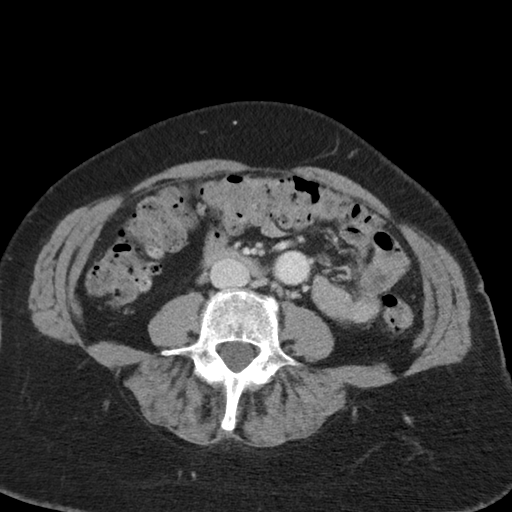
[im 47/75  soft-tissue]
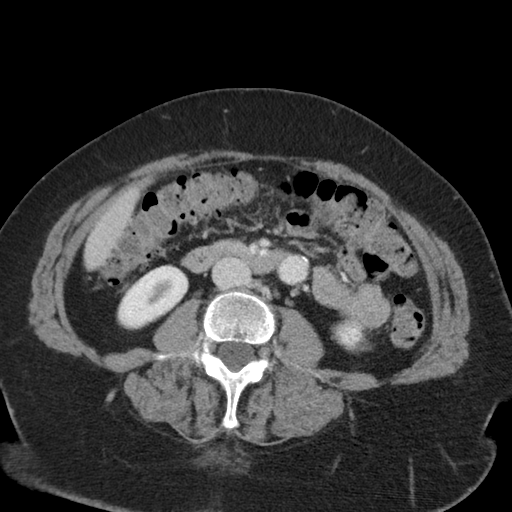
[im 47/75  bone]
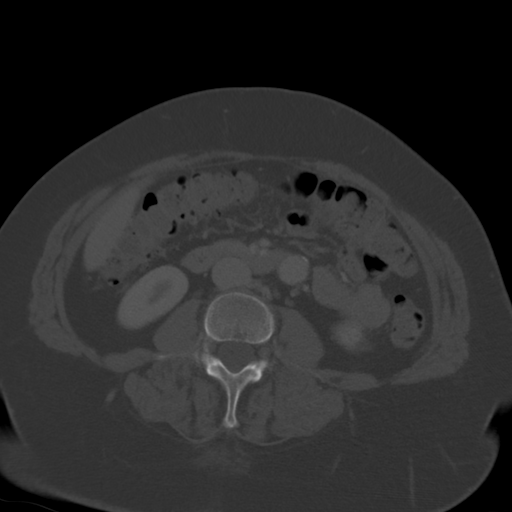
[im 55/75  soft-tissue]
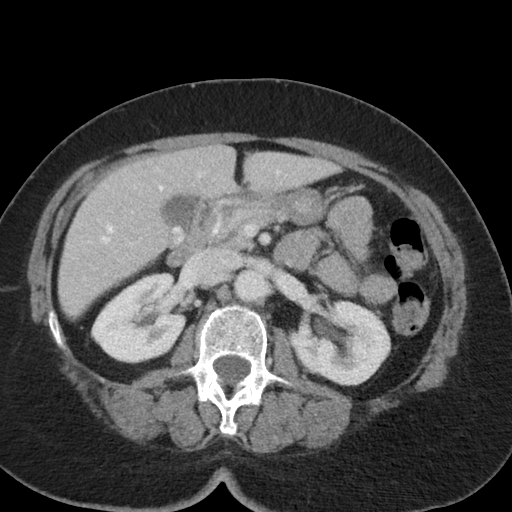
[im 59/75  soft-tissue]
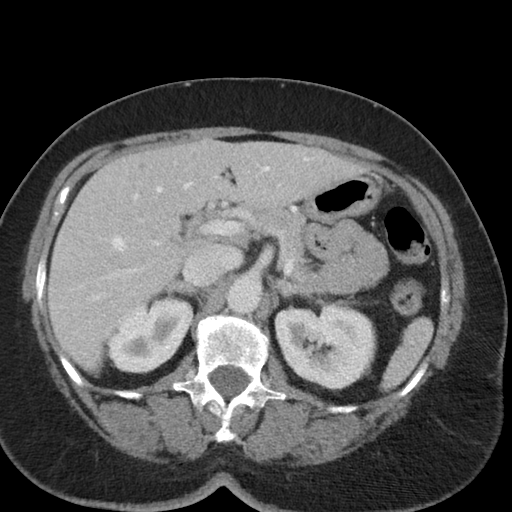
[im 63/75  soft-tissue]
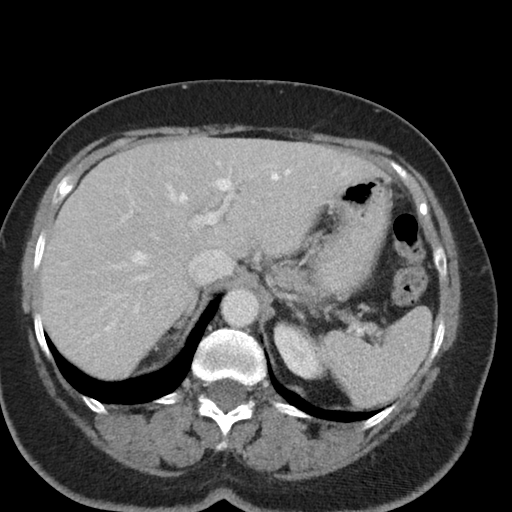
[im 71/75  soft-tissue]
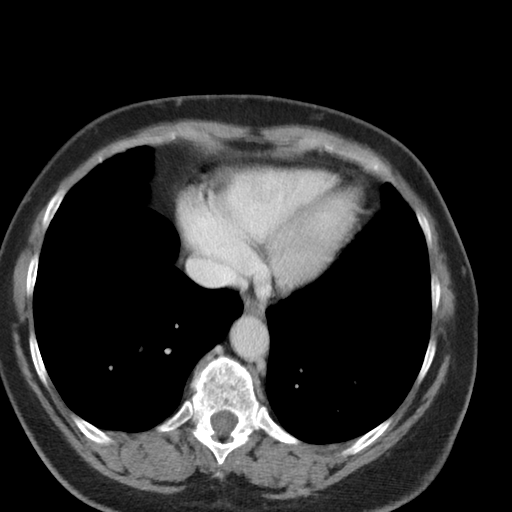

[Series 4: coronal st · coronal · 0.63mm/px · 3 of 76 slices shown]
[im 26/76  soft-tissue]
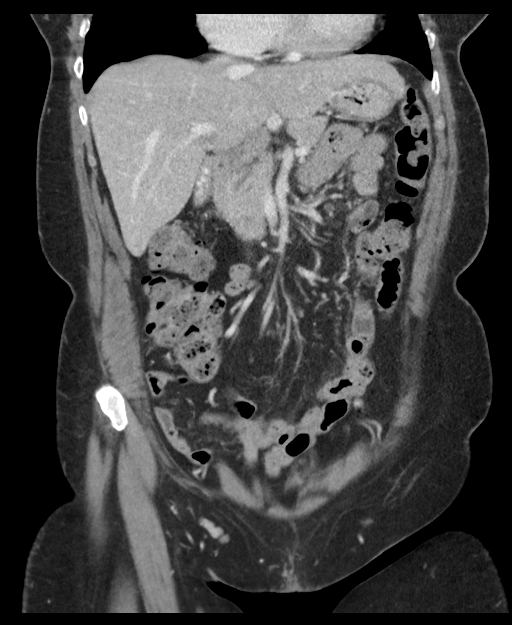
[im 34/76  soft-tissue]
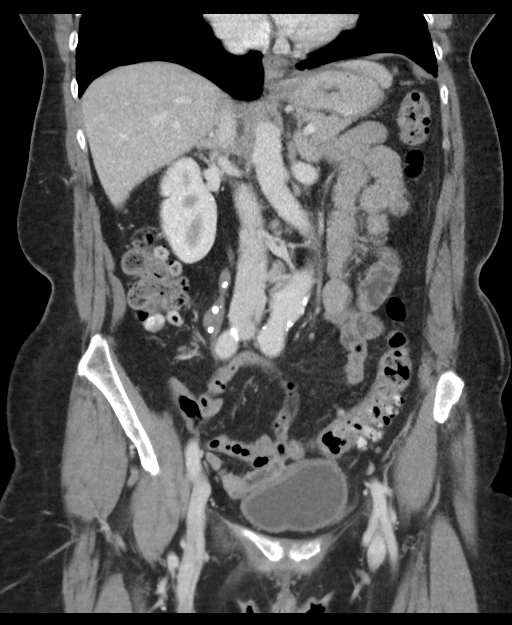
[im 42/76  soft-tissue]
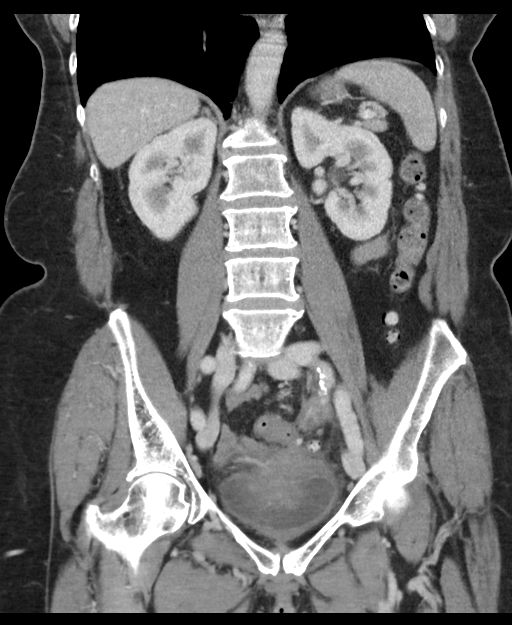

[16 of 46 positions shown; findings below may reference images not displayed]

FINDINGS: Lower chest: The visualized lung bases are clear.

No intra-abdominal free air or free fluid.

Hepatobiliary: The liver is unremarkable. No intrahepatic biliary
dilatation. Multiple gallstones. No pericholecystic fluid or
evidence of acute cholecystitis by CT.

Pancreas: Unremarkable. No pancreatic ductal dilatation or
surrounding inflammatory changes.

Spleen: Normal in size without focal abnormality.

Adrenals/Urinary Tract: The adrenal glands unremarkable there is no
hydronephrosis or nephrolithiasis on either side. Multiple calcified
foci along the course of the right ureter, likely calcification of
the collateral vein. There is thickened posterior wall and dome of
the bladder, likely related to chronic inflammation. Correlation
with urinalysis recommended to exclude cystitis. There is loss of
fat plane between the posterior bladder and vaginal cuff consistent
with adhesions.

Stomach/Bowel: There is moderate stool throughout the colon. There
is colonic diverticulosis without active inflammatory changes. There
is loss of fat plane between the sigmoid colon and vaginal cuff with
tract likely extension of air into the vagina consistent with a colo
vaginal fistula.

There is no bowel obstruction. The appendix is not visualized with
certainty. No inflammatory changes identified in the right lower
quadrant.

Vascular/Lymphatic: Mild aortoiliac atherosclerotic disease. The IVC
is unremarkable. No portal venous gas. There is no adenopathy.

Reproductive: Hysterectomy. Air within the vaginal cuff consistent
with colovaginal fistula. No adnexal masses.

Other: Midline vertical anterior pelvic wall incisional scar.

Musculoskeletal: No acute or significant osseous findings.
IMPRESSION: 1. Colovaginal fistula.
2. Colonic diverticulosis. No bowel obstruction.
3. Cholelithiasis.
4. Aortic Atherosclerosis ([id number]).

## 2023-11-03 NOTE — Addendum Note (Signed)
Addendum  created 11/03/23 0758 by Eilene Ghazi, MD   Clinical Note Signed, Delete clinical note

## 2023-11-03 NOTE — Anesthesia Postprocedure Evaluation (Deleted)
Anesthesia Post Note  Patient: Kylie Casey  Procedure(s) Performed: ROBOTIC RESECTION OF COLON RECTOSIGMOID CYSTOSCOPY with FIREFLY INJECTION     Patient location during evaluation: PACU Anesthesia Type: General Level of consciousness: awake and alert Pain management: pain level controlled Vital Signs Assessment: post-procedure vital signs reviewed and stable Respiratory status: spontaneous breathing, nonlabored ventilation, respiratory function stable and patient connected to nasal cannula oxygen Cardiovascular status: blood pressure returned to baseline and stable Postop Assessment: no apparent nausea or vomiting Anesthetic complications: no  No notable events documented.  Last Vitals:  Vitals:   10/28/23 0034 10/28/23 0543  BP: 123/76 116/77  Pulse: 97 91  Resp: 16 15  Temp: 36.8 C 36.6 C  SpO2: 96% 99%    Last Pain:  Vitals:   10/28/23 0034  TempSrc: Oral  PainSc:                  Jagjit Riner S

## 2023-11-03 NOTE — Anesthesia Postprocedure Evaluation (Signed)
Anesthesia Post Note  Patient: Delaynie L Patella  Procedure(s) Performed: ROBOTIC RESECTION OF COLON RECTOSIGMOID CYSTOSCOPY with FIREFLY INJECTION     Patient location during evaluation: Other Anesthesia Type: General Level of consciousness: awake and alert Pain management: pain level controlled Vital Signs Assessment: post-procedure vital signs reviewed and stable Respiratory status: spontaneous breathing, nonlabored ventilation, respiratory function stable and patient connected to nasal cannula oxygen Cardiovascular status: blood pressure returned to baseline and stable Postop Assessment: no apparent nausea or vomiting Anesthetic complications: no  No notable events documented.  Last Vitals:  Vitals:   10/28/23 0034 10/28/23 0543  BP: 123/76 116/77  Pulse: 97 91  Resp: 16 15  Temp: 36.8 C 36.6 C  SpO2: 96% 99%    Last Pain:  Vitals:   10/28/23 0034  TempSrc: Oral  PainSc:                  Luqman Perrelli S

## 2023-12-01 DIAGNOSIS — K5730 Diverticulosis of large intestine without perforation or abscess without bleeding: Secondary | ICD-10-CM | POA: Insufficient documentation

## 2024-03-01 ENCOUNTER — Ambulatory Visit (INDEPENDENT_AMBULATORY_CARE_PROVIDER_SITE_OTHER): Payer: Self-pay | Admitting: Family Medicine

## 2024-03-01 ENCOUNTER — Encounter: Payer: Self-pay | Admitting: Family Medicine

## 2024-03-01 VITALS — BP 136/80 | HR 66 | Ht 61.0 in | Wt 136.0 lb

## 2024-03-01 DIAGNOSIS — E7849 Other hyperlipidemia: Secondary | ICD-10-CM

## 2024-03-01 DIAGNOSIS — R7301 Impaired fasting glucose: Secondary | ICD-10-CM | POA: Diagnosis not present

## 2024-03-01 DIAGNOSIS — E559 Vitamin D deficiency, unspecified: Secondary | ICD-10-CM

## 2024-03-01 DIAGNOSIS — R7303 Prediabetes: Secondary | ICD-10-CM

## 2024-03-01 DIAGNOSIS — E038 Other specified hypothyroidism: Secondary | ICD-10-CM | POA: Diagnosis not present

## 2024-03-01 NOTE — Progress Notes (Signed)
 Established Patient Office Visit  Subjective:  Patient ID: Kylie Casey, female    DOB: Oct 30, 1946  Age: 78 y.o. MRN: [id number]  CC:  Chief Complaint  Patient presents with   Follow-up    6 month review vitamins to see if she is taking correct type and amount.     HPI Kylie Casey is a 78 y.o. female with past medical history of osteoporosis and prediabetes presents for f/u of  chronic medical conditions.  For the details of today's visit, please refer to the assessment and plan.     Past Medical History:  Diagnosis Date   Osteoporosis    Pre-diabetes    Vitamin D deficiency     Past Surgical History:  Procedure Laterality Date   ABDOMINAL HYSTERECTOMY     COLONOSCOPY     COLONOSCOPY WITH PROPOFOL N/A 07/22/2023   Procedure: COLONOSCOPY WITH PROPOFOL;  Surgeon: Lanelle Bal, DO;  Location: [facility];  Service: Endoscopy;  Laterality: N/A;  12:15pm, asa 2   cyst removed      Family History  Problem Relation Age of Onset   Stroke Mother    Stroke Father    Hypertension Father    Colon cancer Neg Hx     Social History   Socioeconomic History   Marital status: Single    Spouse name: Not on file   Number of children: 2   Years of education: Not on file   Highest education level: Not on file  Occupational History   Occupation: retired  Tobacco Use   Smoking status: Never   Smokeless tobacco: Never  Vaping Use   Vaping status: Never Used  Substance and Sexual Activity   Alcohol use: Never   Drug use: Never   Sexual activity: Not on file  Other Topics Concern   Not on file  Social History Narrative   Never married, 2 daughters   Social Drivers of Corporate investment banker Strain: Low Risk  (10/15/2023)   Overall Financial Resource Strain (CARDIA)    Difficulty of Paying Living Expenses: Not hard at all  Food Insecurity: No Food Insecurity (10/29/2023)   Hunger Vital Sign    Worried About Running Out of Food in the Last Year: Never true     Ran Out of Food in the Last Year: Never true  Transportation Needs: No Transportation Needs (10/29/2023)   PRAPARE - Administrator, Civil Service (Medical): No    Lack of Transportation (Non-Medical): No  Physical Activity: Insufficiently Active (10/15/2023)   Exercise Vital Sign    Days of Exercise per Week: 2 days    Minutes of Exercise per Session: 30 min  Stress: No Stress Concern Present (10/15/2023)   [employer] of Occupational Health - Occupational Stress Questionnaire    Feeling of Stress : Only a little  Social Connections: Socially Isolated (10/15/2023)   Social Connection and Isolation Panel [NHANES]    Frequency of Communication with Friends and Family: More than three times a week    Frequency of Social Gatherings with Friends and Family: More than three times a week    Attends Religious Services: Never    Database administrator or Organizations: No    Attends Banker Meetings: Never    Marital Status: Never married  Intimate Partner Violence: Not At Risk (10/29/2023)   Humiliation, Afraid, Rape, and Kick questionnaire    Fear of Current or Ex-Partner: No    Emotionally  Abused: No    Physically Abused: No    Sexually Abused: No    Outpatient Medications Prior to Visit  Medication Sig Dispense Refill   Ascorbic Acid (VITAMIN C) 1000 MG tablet Take 1,500 mg by mouth daily.     CALCIUM PO Take 1 tablet by mouth in the morning and at bedtime.     cholecalciferol (VITAMIN D3) 25 MCG (1000 UNIT) tablet Take 1,000 Units by mouth daily.     Cod Liver Oil CAPS Take 1 capsule by mouth daily.     Flaxseed, Linseed, (FLAXSEED OIL PO) Take 5 mLs by mouth 2 (two) times a week.     Multiple Vitamin (MULTIVITAMIN WITH MINERALS) TABS tablet Take 1 tablet by mouth daily.     vitamin E 180 MG (400 UNITS) capsule Take 400 Units by mouth daily.     vitamin k 100 MCG tablet Take 1,600 mcg by mouth daily.     risedronate (ACTONEL) 35 MG tablet Take 1 tablet  (35 mg total) by mouth every 7 (seven) days. with water on empty stomach, nothing by mouth or lie down for next 30 minutes. (Patient not taking: Reported on 10/29/2023) 30 tablet 0   No facility-administered medications prior to visit.    No Known Allergies  ROS Review of Systems  Constitutional:  Negative for chills and fever.  Eyes:  Negative for visual disturbance.  Respiratory:  Negative for chest tightness and shortness of breath.   Neurological:  Negative for dizziness and headaches.      Objective:    Physical Exam HENT:     Head: Normocephalic.     Mouth/Throat:     Mouth: Mucous membranes are moist.  Cardiovascular:     Rate and Rhythm: Normal rate.     Heart sounds: Normal heart sounds.  Pulmonary:     Effort: Pulmonary effort is normal.     Breath sounds: Normal breath sounds.  Neurological:     Mental Status: She is alert.     BP 136/80   Pulse 66   Ht 5\' 1"  (1.549 m)   Wt 136 lb (61.7 kg)   SpO2 92%   BMI 25.70 kg/m  Wt Readings from Last 3 Encounters:  03/01/24 136 lb (61.7 kg)  10/28/23 142 lb 13.7 oz (64.8 kg)  10/15/23 140 lb (63.5 kg)    Lab Results  Component Value Date   TSH 3.610 06/02/2023   Lab Results  Component Value Date   WBC 7.1 10/27/2023   HGB 9.2 (L) 10/28/2023   HCT 23.0 (L) 10/27/2023   MCV 88.8 10/27/2023   PLT 153 10/27/2023   Lab Results  Component Value Date   NA 138 10/24/2023   K 3.5 10/28/2023   CO2 24 10/24/2023   GLUCOSE 141 (H) 10/24/2023   BUN 8 10/24/2023   CREATININE 0.60 10/27/2023   BILITOT 0.9 06/02/2023   ALKPHOS 105 06/02/2023   AST 13 06/02/2023   ALT 7 06/02/2023   PROT 7.3 06/02/2023   ALBUMIN 3.9 06/02/2023   CALCIUM 8.4 (L) 10/24/2023   ANIONGAP 7 10/24/2023   EGFR 82 06/02/2023   Lab Results  Component Value Date   CHOL 188 06/02/2023   Lab Results  Component Value Date   HDL 74 06/02/2023   Lab Results  Component Value Date   LDLCALC 102 (H) 06/02/2023   Lab Results   Component Value Date   TRIG 62 06/02/2023   Lab Results  Component Value Date  CHOLHDL 2.5 06/02/2023   Lab Results  Component Value Date   HGBA1C 5.7 (H) 06/02/2023      Assessment & Plan:  Vitamin D deficiency Assessment & Plan: Encouraged to increase his intake of vitamin D-rich foods such as fatty fish (e.g., salmon, mackerel, and sardines), fortified dairy products, egg yolks, and fortified cereals.   Orders: -     VITAMIN D 25 Hydroxy (Vit-D Deficiency, Fractures)  Prediabetes Assessment & Plan: Lifestyle modifications for prediabetes were discussed, including adopting a heart-healthy diet and increasing physical activity. The patient was encouraged to decrease her intake of high-sugar foods and beverages. She verbalized understanding and is aware of the plan of care.    IFG (impaired fasting glucose) -     Hemoglobin A1c  TSH (thyroid-stimulating hormone deficiency) -     TSH + free T4  Other hyperlipidemia -     Lipid panel -     CMP14+EGFR -     CBC with Differential/Platelet  Note: This chart has been completed using Engineer, civil (consulting) software, and while attempts have been made to ensure accuracy, certain words and phrases may not be transcribed as intended.    Follow-up: Return in about 4 months (around 07/01/2024).   Gilmore Laroche, FNP

## 2024-03-01 NOTE — Patient Instructions (Signed)

## 2024-03-01 NOTE — Assessment & Plan Note (Signed)
 Lifestyle modifications for prediabetes were discussed, including adopting a heart-healthy diet and increasing physical activity. The patient was encouraged to decrease her intake of high-sugar foods and beverages. She verbalized understanding and is aware of the plan of care.

## 2024-03-01 NOTE — Assessment & Plan Note (Signed)
 Encouraged to increase his intake of vitamin D-rich foods such as fatty fish (e.g., salmon, mackerel, and sardines), fortified dairy products, egg yolks, and fortified cereals.

## 2024-03-02 LAB — CMP14+EGFR
ALT: 8 IU/L (ref 0–32)
AST: 17 IU/L (ref 0–40)
Albumin: 3.9 g/dL (ref 3.8–4.8)
Alkaline Phosphatase: 96 IU/L (ref 44–121)
BUN/Creatinine Ratio: 17 (ref 12–28)
BUN: 13 mg/dL (ref 8–27)
Bilirubin Total: 0.9 mg/dL (ref 0.0–1.2)
CO2: 24 mmol/L (ref 20–29)
Calcium: 9.1 mg/dL (ref 8.7–10.3)
Chloride: 102 mmol/L (ref 96–106)
Creatinine, Ser: 0.75 mg/dL (ref 0.57–1.00)
Globulin, Total: 3.3 g/dL (ref 1.5–4.5)
Glucose: 90 mg/dL (ref 70–99)
Potassium: 4.2 mmol/L (ref 3.5–5.2)
Sodium: 140 mmol/L (ref 134–144)
Total Protein: 7.2 g/dL (ref 6.0–8.5)
eGFR: 81 mL/min/{1.73_m2} (ref >59)

## 2024-03-02 LAB — TSH+FREE T4
Free T4: 1.09 ng/dL (ref 0.82–1.77)
TSH: 3.02 u[IU]/mL (ref 0.450–4.500)

## 2024-03-02 LAB — CBC WITH DIFFERENTIAL/PLATELET
Basophils Absolute: 0 10*3/uL (ref 0.0–0.2)
Basos: 1 %
EOS (ABSOLUTE): 0.2 10*3/uL (ref 0.0–0.4)
Eos: 6 %
Hematocrit: 40 % (ref 34.0–46.6)
Hemoglobin: 13.5 g/dL (ref 11.1–15.9)
Immature Grans (Abs): 0 10*3/uL (ref 0.0–0.1)
Immature Granulocytes: 0 %
Lymphocytes Absolute: 1.1 10*3/uL (ref 0.7–3.1)
Lymphs: 32 %
MCH: 29 pg (ref 26.6–33.0)
MCHC: 33.8 g/dL (ref 31.5–35.7)
MCV: 86 fL (ref 79–97)
Monocytes Absolute: 0.2 10*3/uL (ref 0.1–0.9)
Monocytes: 7 %
Neutrophils Absolute: 1.9 10*3/uL (ref 1.4–7.0)
Neutrophils: 54 %
Platelets: 192 10*3/uL (ref 150–450)
RBC: 4.65 x10E6/uL (ref 3.77–5.28)
RDW: 14.5 % (ref 11.7–15.4)
WBC: 3.4 10*3/uL (ref 3.4–10.8)

## 2024-03-02 LAB — LIPID PANEL
Chol/HDL Ratio: 2.4 ratio (ref 0.0–4.4)
Cholesterol, Total: 169 mg/dL (ref 100–199)
HDL: 70 mg/dL (ref >39)
LDL Chol Calc (NIH): 89 mg/dL (ref 0–99)
Triglycerides: 50 mg/dL (ref 0–149)
VLDL Cholesterol Cal: 10 mg/dL (ref 5–40)

## 2024-03-02 LAB — HEMOGLOBIN A1C
Est. average glucose Bld gHb Est-mCnc: 120 mg/dL
Hgb A1c MFr Bld: 5.8 % — ABNORMAL HIGH (ref 4.8–5.6)

## 2024-03-02 LAB — VITAMIN D 25 HYDROXY (VIT D DEFICIENCY, FRACTURES): Vit D, 25-Hydroxy: 54.5 ng/mL (ref 30.0–100.0)

## 2024-03-04 DIAGNOSIS — H04123 Dry eye syndrome of bilateral lacrimal glands: Secondary | ICD-10-CM | POA: Diagnosis not present

## 2024-03-04 DIAGNOSIS — H2513 Age-related nuclear cataract, bilateral: Secondary | ICD-10-CM | POA: Diagnosis not present

## 2024-03-04 DIAGNOSIS — H5211 Myopia, right eye: Secondary | ICD-10-CM | POA: Diagnosis not present

## 2024-03-04 DIAGNOSIS — H524 Presbyopia: Secondary | ICD-10-CM | POA: Diagnosis not present

## 2024-03-04 DIAGNOSIS — H52223 Regular astigmatism, bilateral: Secondary | ICD-10-CM | POA: Diagnosis not present

## 2024-03-06 NOTE — Progress Notes (Signed)
 Hello Kylie Casey Your labs indicate that you are prediabetic, and I recommend decreasing your intake of high-sugar foods and beverages, along with increased physical activity. All other labs are stable.

## 2024-04-29 DIAGNOSIS — H25813 Combined forms of age-related cataract, bilateral: Secondary | ICD-10-CM | POA: Diagnosis not present

## 2024-04-29 DIAGNOSIS — H04123 Dry eye syndrome of bilateral lacrimal glands: Secondary | ICD-10-CM | POA: Diagnosis not present

## 2024-05-11 DIAGNOSIS — H25811 Combined forms of age-related cataract, right eye: Secondary | ICD-10-CM | POA: Diagnosis not present

## 2024-05-25 DIAGNOSIS — H25811 Combined forms of age-related cataract, right eye: Secondary | ICD-10-CM | POA: Diagnosis not present

## 2024-05-25 DIAGNOSIS — H268 Other specified cataract: Secondary | ICD-10-CM | POA: Diagnosis not present

## 2024-07-01 ENCOUNTER — Ambulatory Visit: Admitting: Family Medicine

## 2024-08-09 DIAGNOSIS — Z961 Presence of intraocular lens: Secondary | ICD-10-CM | POA: Diagnosis not present

## 2024-08-09 DIAGNOSIS — H25812 Combined forms of age-related cataract, left eye: Secondary | ICD-10-CM | POA: Diagnosis not present

## 2024-08-09 DIAGNOSIS — H04123 Dry eye syndrome of bilateral lacrimal glands: Secondary | ICD-10-CM | POA: Diagnosis not present

## 2024-08-19 ENCOUNTER — Ambulatory Visit (INDEPENDENT_AMBULATORY_CARE_PROVIDER_SITE_OTHER): Admitting: Family Medicine

## 2024-08-19 VITALS — BP 132/78 | HR 62 | Ht 61.5 in | Wt 134.1 lb

## 2024-08-19 DIAGNOSIS — E038 Other specified hypothyroidism: Secondary | ICD-10-CM | POA: Diagnosis not present

## 2024-08-19 DIAGNOSIS — E7849 Other hyperlipidemia: Secondary | ICD-10-CM | POA: Diagnosis not present

## 2024-08-19 DIAGNOSIS — R7303 Prediabetes: Secondary | ICD-10-CM | POA: Diagnosis not present

## 2024-08-19 DIAGNOSIS — R7301 Impaired fasting glucose: Secondary | ICD-10-CM

## 2024-08-19 DIAGNOSIS — E559 Vitamin D deficiency, unspecified: Secondary | ICD-10-CM | POA: Diagnosis not present

## 2024-08-19 NOTE — Assessment & Plan Note (Signed)
 Lifestyle modifications for prediabetes were discussed, including adopting a heart-healthy diet and increasing physical activity. The patient was encouraged to decrease her intake of high-sugar foods and beverages. She verbalized understanding and is aware of the plan of care.

## 2024-08-19 NOTE — Patient Instructions (Addendum)
 I appreciate the opportunity to provide care to you today!    Follow up:  6 months  Labs: please stop by the lab today to get your blood drawn (CBC, CMP, TSH, Lipid profile, HgA1c, Vit D)  Schedule medicare annual wellness visit  For a Healthier YOU, I Recommend: Reducing your intake of sugar, sodium, carbohydrates, and saturated fats. Increasing your fiber intake by incorporating more whole grains, fruits, and vegetables into your meals. Setting healthy goals with a focus on lowering your consumption of carbs, sugar, and unhealthy fats. Adding variety to your diet by including a wide range of fruits and vegetables. Cutting back on soda and limiting processed foods as much as possible. Staying active: In addition to taking your weight loss medication, aim for at least 150 minutes of moderate-intensity physical activity each week for optimal results.   Please follow up if your symptoms worsen or fail to improve.    Please continue to a heart-healthy diet and increase your physical activities. Try to exercise for at least five days a week.    It was a pleasure to see you and I look forward to continuing to work together on your health and well-being. Please do not hesitate to call the office if you need care or have questions about your care.  In case of emergency, please visit the Emergency Department for urgent care, or contact our clinic at [phone] to schedule an appointment. We're here to help you!   Have a wonderful day and week. With Gratitude, Colvin Blatt MSN, FNP-BC

## 2024-08-19 NOTE — Progress Notes (Signed)
 Established Patient Office Visit  Subjective:  Patient ID: Kylie Casey, female    DOB: [birth year]  Age: 78 y.o. MRN: [id number]  CC:  Chief Complaint  Patient presents with   Follow-up    Vitamin D  deficiency     HPI Kylie Casey is a 78 y.o. female with past medical history of prediabetes, osteoporosis and vit d def presents for f/u of  chronic medical conditions.  The patient reports having cataract surgery on the right eye in June and is scheduled for left eye cataract surgery in September 2025.  Past Medical History:  Diagnosis Date   Osteoporosis    Pre-diabetes    Vitamin D  deficiency     Past Surgical History:  Procedure Laterality Date   ABDOMINAL HYSTERECTOMY     COLONOSCOPY     COLONOSCOPY WITH PROPOFOL  N/A 07/22/2023   Procedure: COLONOSCOPY WITH PROPOFOL ;  Surgeon: Cindie Carlin POUR, DO;  Location: [facility];  Service: Endoscopy;  Laterality: N/A;  12:15pm, asa 2   cyst removed      Family History  Problem Relation Age of Onset   Stroke Mother    Stroke Father    Hypertension Father    Colon cancer Neg Hx     Social History   Socioeconomic History   Marital status: Single    Spouse name: Not on file   Number of children: 2   Years of education: Not on file   Highest education level: Not on file  Occupational History   Occupation: retired  Tobacco Use   Smoking status: Never   Smokeless tobacco: Never  Vaping Use   Vaping status: Never Used  Substance and Sexual Activity   Alcohol use: Never   Drug use: Never   Sexual activity: Not on file  Other Topics Concern   Not on file  Social History Narrative   Never married, 2 daughters   Social Drivers of Corporate investment banker Strain: Low Risk  (10/15/2023)   Overall Financial Resource Strain (CARDIA)    Difficulty of Paying Living Expenses: Not hard at all  Food Insecurity: No Food Insecurity (10/29/2023)   Hunger Vital Sign    Worried About Running Out of Food in the Last Year:  Never true    Ran Out of Food in the Last Year: Never true  Transportation Needs: No Transportation Needs (10/29/2023)   PRAPARE - Administrator, Civil Service (Medical): No    Lack of Transportation (Non-Medical): No  Physical Activity: Insufficiently Active (10/15/2023)   Exercise Vital Sign    Days of Exercise per Week: 2 days    Minutes of Exercise per Session: 30 min  Stress: No Stress Concern Present (10/15/2023)   [employer] of Occupational Health - Occupational Stress Questionnaire    Feeling of Stress : Only a little  Social Connections: Socially Isolated (10/15/2023)   Social Connection and Isolation Panel    Frequency of Communication with Friends and Family: More than three times a week    Frequency of Social Gatherings with Friends and Family: More than three times a week    Attends Religious Services: Never    Database administrator or Organizations: No    Attends Banker Meetings: Never    Marital Status: Never married  Intimate Partner Violence: Not At Risk (10/29/2023)   Humiliation, Afraid, Rape, and Kick questionnaire    Fear of Current or Ex-Partner: No    Emotionally Abused: No  Physically Abused: No    Sexually Abused: No    Outpatient Medications Prior to Visit  Medication Sig Dispense Refill   Ascorbic Acid (VITAMIN C) 1000 MG tablet Take 1,500 mg by mouth daily.     CALCIUM  PO Take 1 tablet by mouth in the morning and at bedtime.     cholecalciferol  (VITAMIN D3) 25 MCG (1000 UNIT) tablet Take 1,000 Units by mouth daily.     Cod Liver Oil CAPS Take 1 capsule by mouth daily.     Flaxseed, Linseed, (FLAXSEED OIL PO) Take 5 mLs by mouth 2 (two) times a week.     Multiple Vitamin (MULTIVITAMIN WITH MINERALS) TABS tablet Take 1 tablet by mouth daily.     vitamin E 180 MG (400 UNITS) capsule Take 400 Units by mouth daily.     vitamin k 100 MCG tablet Take 1,600 mcg by mouth daily.     risedronate  (ACTONEL ) 35 MG tablet Take 1  tablet (35 mg total) by mouth every 7 (seven) days. with water  on empty stomach, nothing by mouth or lie down for next 30 minutes. (Patient not taking: Reported on 08/19/2024) 30 tablet 0   No facility-administered medications prior to visit.    No Known Allergies  ROS Review of Systems  Constitutional:  Negative for chills and fever.  Eyes:  Negative for visual disturbance.  Respiratory:  Negative for chest tightness and shortness of breath.   Neurological:  Negative for dizziness and headaches.      Objective:    Physical Exam HENT:     Head: Normocephalic.     Mouth/Throat:     Mouth: Mucous membranes are moist.  Cardiovascular:     Rate and Rhythm: Normal rate.     Heart sounds: Normal heart sounds.  Pulmonary:     Effort: Pulmonary effort is normal.     Breath sounds: Normal breath sounds.  Neurological:     Mental Status: She is alert.     BP 132/78 (BP Location: Left Arm, Patient Position: Sitting, Cuff Size: Large)   Pulse 62   Ht 5' 1.5 (1.562 m)   Wt 134 lb 1.9 oz (60.8 kg)   SpO2 91%   BMI 24.93 kg/m  Wt Readings from Last 3 Encounters:  08/19/24 134 lb 1.9 oz (60.8 kg)  03/01/24 136 lb (61.7 kg)  10/28/23 142 lb 13.7 oz (64.8 kg)    Lab Results  Component Value Date   TSH 3.020 03/01/2024   Lab Results  Component Value Date   WBC 3.4 03/01/2024   HGB 13.5 03/01/2024   HCT 40.0 03/01/2024   MCV 86 03/01/2024   PLT 192 03/01/2024   Lab Results  Component Value Date   NA 140 03/01/2024   K 4.2 03/01/2024   CO2 24 03/01/2024   GLUCOSE 90 03/01/2024   BUN 13 03/01/2024   CREATININE 0.75 03/01/2024   BILITOT 0.9 03/01/2024   ALKPHOS 96 03/01/2024   AST 17 03/01/2024   ALT 8 03/01/2024   PROT 7.2 03/01/2024   ALBUMIN 3.9 03/01/2024   CALCIUM  9.1 03/01/2024   ANIONGAP 7 10/24/2023   EGFR 81 03/01/2024   Lab Results  Component Value Date   CHOL 169 03/01/2024   Lab Results  Component Value Date   HDL 70 03/01/2024   Lab Results   Component Value Date   LDLCALC 89 03/01/2024   Lab Results  Component Value Date   TRIG 50 03/01/2024   Lab Results  Component  Value Date   CHOLHDL 2.4 03/01/2024   Lab Results  Component Value Date   HGBA1C 5.8 (H) 03/01/2024      Assessment & Plan:  Prediabetes Assessment & Plan: Lifestyle modifications for prediabetes were discussed, including adopting a heart-healthy diet and increasing physical activity. The patient was encouraged to decrease her intake of high-sugar foods and beverages. She verbalized understanding and is aware of the plan of care.    Vitamin D  deficiency Assessment & Plan: Encouraged to increase his intake of vitamin D -rich foods such as fatty fish (e.g., salmon, mackerel, and sardines), fortified dairy products, egg yolks, and fortified cereals.   Orders: -     VITAMIN D  25 Hydroxy (Vit-D Deficiency, Fractures)  IFG (impaired fasting glucose) -     Hemoglobin A1c  TSH (thyroid -stimulating hormone deficiency) -     TSH + free T4  Other hyperlipidemia -     Lipid panel -     CMP14+EGFR -     CBC with Differential/Platelet  Note: This chart has been completed using Engineer, civil (consulting) software, and while attempts have been made to ensure accuracy, certain words and phrases may not be transcribed as intended.    Follow-up: Return in about 6 months (around 02/16/2025).   Mallorey Odonell, FNP

## 2024-08-19 NOTE — Assessment & Plan Note (Signed)
 Encouraged to increase his intake of vitamin D-rich foods such as fatty fish (e.g., salmon, mackerel, and sardines), fortified dairy products, egg yolks, and fortified cereals.

## 2024-08-20 ENCOUNTER — Ambulatory Visit: Payer: Self-pay | Admitting: Family Medicine

## 2024-08-20 LAB — CMP14+EGFR
ALT: 7 IU/L (ref 0–32)
AST: 16 IU/L (ref 0–40)
Albumin: 3.9 g/dL (ref 3.8–4.8)
Alkaline Phosphatase: 82 IU/L (ref 44–121)
BUN/Creatinine Ratio: 20 (ref 12–28)
BUN: 15 mg/dL (ref 8–27)
Bilirubin Total: 0.9 mg/dL (ref 0.0–1.2)
CO2: 23 mmol/L (ref 20–29)
Calcium: 9.2 mg/dL (ref 8.7–10.3)
Chloride: 103 mmol/L (ref 96–106)
Creatinine, Ser: 0.74 mg/dL (ref 0.57–1.00)
Globulin, Total: 3.3 g/dL (ref 1.5–4.5)
Glucose: 85 mg/dL (ref 70–99)
Potassium: 4.2 mmol/L (ref 3.5–5.2)
Sodium: 140 mmol/L (ref 134–144)
Total Protein: 7.2 g/dL (ref 6.0–8.5)
eGFR: 83 mL/min/1.73 (ref >59)

## 2024-08-20 LAB — TSH+FREE T4
Free T4: 1.17 ng/dL (ref 0.82–1.77)
TSH: 4.36 u[IU]/mL (ref 0.450–4.500)

## 2024-08-20 LAB — CBC WITH DIFFERENTIAL/PLATELET
Basophils Absolute: 0 x10E3/uL (ref 0.0–0.2)
Basos: 1 %
EOS (ABSOLUTE): 0.2 x10E3/uL (ref 0.0–0.4)
Eos: 4 %
Hematocrit: 41.9 % (ref 34.0–46.6)
Hemoglobin: 14 g/dL (ref 11.1–15.9)
Immature Grans (Abs): 0 x10E3/uL (ref 0.0–0.1)
Immature Granulocytes: 0 %
Lymphocytes Absolute: 1.2 x10E3/uL (ref 0.7–3.1)
Lymphs: 31 %
MCH: 30.8 pg (ref 26.6–33.0)
MCHC: 33.4 g/dL (ref 31.5–35.7)
MCV: 92 fL (ref 79–97)
Monocytes Absolute: 0.3 x10E3/uL (ref 0.1–0.9)
Monocytes: 8 %
Neutrophils Absolute: 2.1 x10E3/uL (ref 1.4–7.0)
Neutrophils: 56 %
Platelets: 178 x10E3/uL (ref 150–450)
RBC: 4.54 x10E6/uL (ref 3.77–5.28)
RDW: 14 % (ref 11.7–15.4)
WBC: 3.7 x10E3/uL (ref 3.4–10.8)

## 2024-08-20 LAB — HEMOGLOBIN A1C
Est. average glucose Bld gHb Est-mCnc: 108 mg/dL
Hgb A1c MFr Bld: 5.4 % (ref 4.8–5.6)

## 2024-08-20 LAB — LIPID PANEL
Chol/HDL Ratio: 2.2 ratio (ref 0.0–4.4)
Cholesterol, Total: 190 mg/dL (ref 100–199)
HDL: 87 mg/dL (ref >39)
LDL Chol Calc (NIH): 94 mg/dL (ref 0–99)
Triglycerides: 45 mg/dL (ref 0–149)
VLDL Cholesterol Cal: 9 mg/dL (ref 5–40)

## 2024-08-20 LAB — VITAMIN D 25 HYDROXY (VIT D DEFICIENCY, FRACTURES): Vit D, 25-Hydroxy: 44.3 ng/mL (ref 30.0–100.0)

## 2024-08-23 DIAGNOSIS — H25812 Combined forms of age-related cataract, left eye: Secondary | ICD-10-CM | POA: Diagnosis not present

## 2024-09-14 DIAGNOSIS — H268 Other specified cataract: Secondary | ICD-10-CM | POA: Diagnosis not present

## 2024-09-14 DIAGNOSIS — H25812 Combined forms of age-related cataract, left eye: Secondary | ICD-10-CM | POA: Diagnosis not present

## 2024-10-19 DIAGNOSIS — Z8249 Family history of ischemic heart disease and other diseases of the circulatory system: Secondary | ICD-10-CM | POA: Diagnosis not present

## 2024-10-19 DIAGNOSIS — Z008 Encounter for other general examination: Secondary | ICD-10-CM | POA: Diagnosis not present

## 2024-10-19 DIAGNOSIS — Z823 Family history of stroke: Secondary | ICD-10-CM | POA: Diagnosis not present

## 2024-10-19 DIAGNOSIS — Z87440 Personal history of urinary (tract) infections: Secondary | ICD-10-CM | POA: Diagnosis not present

## 2024-12-28 ENCOUNTER — Ambulatory Visit: Payer: Self-pay

## 2024-12-28 VITALS — Ht 61.5 in | Wt 134.0 lb

## 2024-12-28 DIAGNOSIS — Z0000 Encounter for general adult medical examination without abnormal findings: Secondary | ICD-10-CM

## 2024-12-28 NOTE — Progress Notes (Signed)
 "  Chief Complaint  Patient presents with   Medicare Wellness     Subjective:   Kylie Casey is a 79 y.o. female who presents for a Medicare Annual Wellness Visit.  Visit info / Clinical Intake: Medicare Wellness Visit Type:: Initial Annual Wellness Visit Persons participating in visit and providing information:: patient Medicare Wellness Visit Mode:: Telephone If telephone:: video declined Since this visit was completed virtually, some vitals may be partially provided or unavailable. Missing vitals are due to the limitations of the virtual format.: Documented vitals are patient reported If Telephone or Video please confirm:: I connected with patient using audio/video enable telemedicine. I verified patient identity with two identifiers, discussed telehealth limitations, and patient agreed to proceed. Patient Location:: home Provider Location:: office Interpreter Needed?: No Pre-visit prep was completed: yes AWV questionnaire completed by patient prior to visit?: no Living arrangements:: (!) lives alone Patient's Overall Health Status Rating: very good Typical amount of pain: some Does pain affect daily life?: no Are you currently prescribed opioids?: no  Dietary Habits and Nutritional Risks How many meals a day?: 3 Eats fruit and vegetables daily?: yes Most meals are obtained by: preparing own meals In the last 2 weeks, have you had any of the following?: none Diabetic:: no  Functional Status Activities of Daily Living (to include ambulation/medication): Independent Ambulation: Independent Medication Administration: Independent Home Management (perform basic housework or laundry): Independent Manage your own finances?: yes Primary transportation is: driving Concerns about vision?: no *vision screening is required for WTM* Concerns about hearing?: no  Fall Screening Falls in the past year?: 0 Number of falls in past year: 0 Was there an injury with Fall?: 0 Fall Risk  Category Calculator: 0 Patient Fall Risk Level: Low Fall Risk  Fall Risk Patient at Risk for Falls Due to: No Fall Risks Fall risk Follow up: Falls evaluation completed; Education provided; Falls prevention discussed  Home and Transportation Safety: All rugs have non-skid backing?: yes All stairs or steps have railings?: yes Grab bars in the bathtub or shower?: yes Have non-skid surface in bathtub or shower?: yes Good home lighting?: yes Regular seat belt use?: yes Hospital stays in the last year:: no  Cognitive Assessment Difficulty concentrating, remembering, or making decisions? : no Will 6CIT or Mini Cog be Completed: no 6CIT or Mini Cog Declined: patient has a diagnosis of dementia or cognitive impairment  Advance Directives (For Healthcare) Does Patient Have a Medical Advance Directive?: No Would patient like information on creating a medical advance directive?: Yes (MAU/Ambulatory/Procedural Areas - Information given)  Reviewed/Updated  Reviewed/Updated: Reviewed All (Medical, Surgical, Family, Medications, Allergies, Care Teams, Patient Goals)    Allergies (verified) Patient has no known allergies.   Current Medications (verified) Outpatient Encounter Medications as of 12/28/2024  Medication Sig   Ascorbic Acid (VITAMIN C) 1000 MG tablet Take 1,500 mg by mouth daily.   CALCIUM  PO Take 1 tablet by mouth in the morning and at bedtime.   cholecalciferol  (VITAMIN D3) 25 MCG (1000 UNIT) tablet Take 1,000 Units by mouth daily.   Cod Liver Oil CAPS Take 1 capsule by mouth daily.   Flaxseed, Linseed, (FLAXSEED OIL PO) Take 5 mLs by mouth 2 (two) times a week.   Multiple Vitamin (MULTIVITAMIN WITH MINERALS) TABS tablet Take 1 tablet by mouth daily.   vitamin E 180 MG (400 UNITS) capsule Take 400 Units by mouth daily.   vitamin k 100 MCG tablet Take 1,600 mcg by mouth daily.   risedronate  (ACTONEL ) 35  MG tablet Take 1 tablet (35 mg total) by mouth every 7 (seven) days. with  water  on empty stomach, nothing by mouth or lie down for next 30 minutes. (Patient not taking: Reported on 12/28/2024)   No facility-administered encounter medications on file as of 12/28/2024.    History: Past Medical History:  Diagnosis Date   Osteoporosis    Pre-diabetes    Vitamin D  deficiency    Past Surgical History:  Procedure Laterality Date   ABDOMINAL HYSTERECTOMY     COLONOSCOPY     COLONOSCOPY WITH PROPOFOL  N/A 07/22/2023   Procedure: COLONOSCOPY WITH PROPOFOL ;  Surgeon: Cindie Carlin POUR, DO;  Location: [facility];  Service: Endoscopy;  Laterality: N/A;  12:15pm, asa 2   cyst removed     Family History  Problem Relation Age of Onset   Stroke Mother    Stroke Father    Hypertension Father    Colon cancer Neg Hx    Social History   Occupational History   Occupation: retired  Tobacco Use   Smoking status: Never   Smokeless tobacco: Never  Vaping Use   Vaping status: Never Used  Substance and Sexual Activity   Alcohol use: Never   Drug use: Never   Sexual activity: Not on file   Tobacco Counseling Counseling given: Not Answered  SDOH Screenings   Food Insecurity: No Food Insecurity (12/28/2024)  Housing: Low Risk (12/28/2024)  Transportation Needs: No Transportation Needs (12/28/2024)  Utilities: Not At Risk (12/28/2024)  Alcohol Screen: Low Risk (10/15/2023)  Depression (PHQ2-9): Low Risk (12/28/2024)  Financial Resource Strain: Low Risk (10/15/2023)  Physical Activity: Insufficiently Active (12/28/2024)  Social Connections: Moderately Isolated (12/28/2024)  Stress: No Stress Concern Present (12/28/2024)  Tobacco Use: Low Risk (12/28/2024)  Health Literacy: Adequate Health Literacy (12/28/2024)   See flowsheets for full screening details  Depression Screen PHQ 2 & 9 Depression Scale- Over the past 2 weeks, how often have you been bothered by any of the following problems? Little interest or pleasure in doing things: 0 Feeling down, depressed, or hopeless  (PHQ Adolescent also includes...irritable): 0 PHQ-2 Total Score: 0 Trouble falling or staying asleep, or sleeping too much: 0 Feeling tired or having little energy: 0 Poor appetite or overeating (PHQ Adolescent also includes...weight loss): 0 Feeling bad about yourself - or that you are a failure or have let yourself or your family down: 0 Trouble concentrating on things, such as reading the newspaper or watching television (PHQ Adolescent also includes...like school work): 0 Moving or speaking so slowly that other people could have noticed. Or the opposite - being so fidgety or restless that you have been moving around a lot more than usual: 0 Thoughts that you would be better off dead, or of hurting yourself in some way: 0 PHQ-9 Total Score: 0 If you checked off any problems, how difficult have these problems made it for you to do your work, take care of things at home, or get along with other people?: Not difficult at all  Depression Treatment Depression Interventions/Treatment : EYV7-0 Score <4 Follow-up Not Indicated     Goals Addressed             This Visit's Progress    Maintain health and independence   On track            Objective:    Today's Vitals   12/28/24 0822  Weight: 134 lb (60.8 kg)  Height: 5' 1.5 (1.562 m)   Body mass index is  24.91 kg/m.  Hearing/Vision screen Hearing Screening - Comments:: Patient is able to hear conversational tones without difficulty. No issues reported.   Vision Screening - Comments:: Wears rx glasses - up to date with routine eye exams with Dr. Marcey  Immunizations and Health Maintenance Health Maintenance  Topic Date Due   Pneumococcal Vaccine: 50+ Years (1 of 1 - PCV) Never done   Zoster Vaccines- Shingrix (1 of 2) Never done   COVID-19 Vaccine (1 - 2025-26 season) Never done   Influenza Vaccine  03/15/2025 (Originally 07/16/2024)   Bone Density Scan  07/28/2025   Medicare Annual Wellness (AWV)  12/28/2025   Hepatitis C  Screening  Completed   Meningococcal B Vaccine  Aged Out   DTaP/Tdap/Td  Discontinued   Colonoscopy  Discontinued        Assessment/Plan:  This is a routine wellness examination for Kylie Casey.  Patient Care Team: Bacchus, Meade PEDLAR, FNP as PCP - General (Family Medicine) Sheldon Standing, MD as Consulting Physician (General Surgery) Cindie Carlin POUR, DO as Consulting Physician (Gastroenterology) Marcey Standing PARAS, MD as Consulting Physician (Ophthalmology)  I have personally reviewed and noted the following in the patients chart:   Medical and social history Use of alcohol, tobacco or illicit drugs  Current medications and supplements including opioid prescriptions. Functional ability and status Nutritional status Physical activity Advanced directives List of other physicians Hospitalizations, surgeries, and ER visits in previous 12 months Vitals Screenings to include cognitive, depression, and falls Referrals and appointments  No orders of the defined types were placed in this encounter.  In addition, I have reviewed and discussed with patient certain preventive protocols, quality metrics, and best practice recommendations. A written personalized care plan for preventive services as well as general preventive health recommendations were provided to patient.   Lavelle Charmaine Browner, LPN   [id number]/86/7973   Return in 1 year (on 12/28/2025).  After Visit Summary: (Pick Up) Due to this being a telephonic visit, with patients personalized plan was offered to patient and patient has requested to Pick up at office.  Nurse Notes: No voiced or noted concerns at this time Patient advised to keep follow-up appointment with PCP (02/17/25)  "

## 2024-12-28 NOTE — Patient Instructions (Signed)
 Kylie Casey,  Thank you for taking the time for your Medicare Wellness Visit. I appreciate your continued commitment to your health goals. Please review the care plan we discussed, and feel free to reach out if I can assist you further.  Please note that Annual Wellness Visits do not include a physical exam. Some assessments may be limited, especially if the visit was conducted virtually. If needed, we may recommend an in-person follow-up with your provider.  Ongoing Care Seeing your primary care provider every 3 to 6 months helps us  monitor your health and provide consistent, personalized care.   Referrals If a referral was made during today's visit and you haven't received any updates within two weeks, please contact the referred provider directly to check on the status.  Recommended Screenings:  Health Maintenance  Topic Date Due   Pneumococcal Vaccine for age over 85 (1 of 1 - PCV) Never done   Zoster (Shingles) Vaccine (1 of 2) Never done   COVID-19 Vaccine (1 - 2025-26 season) Never done   Flu Shot  03/15/2025*   Osteoporosis screening with Bone Density Scan  07/28/2025   Medicare Annual Wellness Visit  12/28/2025   Hepatitis C Screening  Completed   Meningitis B Vaccine  Aged Out   DTaP/Tdap/Td vaccine  Discontinued   Colon Cancer Screening  Discontinued  *Topic was postponed. The date shown is not the original due date.       12/28/2024    8:24 AM  Advanced Directives  Does Patient Have a Medical Advance Directive? No  Would patient like information on creating a medical advance directive? Yes (MAU/Ambulatory/Procedural Areas - Information given)   Information on Advanced Care Planning can be found at [place]  Secretary of [facility] Advance Health Care Directives Advance Health Care Directives ([email or web address]   Vision: Annual vision screenings are recommended for early detection of glaucoma, cataracts, and diabetic retinopathy. These exams can also reveal signs of chronic  conditions such as diabetes and high blood pressure.  Dental: Annual dental screenings help detect early signs of oral cancer, gum disease, and other conditions linked to overall health, including heart disease and diabetes.  Please see the attached documents for additional preventive care recommendations.

## 2025-01-07 ENCOUNTER — Encounter: Payer: Self-pay | Admitting: Family Medicine

## 2025-01-07 DIAGNOSIS — Z1231 Encounter for screening mammogram for malignant neoplasm of breast: Secondary | ICD-10-CM

## 2025-01-10 ENCOUNTER — Ambulatory Visit: Payer: Self-pay

## 2025-01-25 ENCOUNTER — Ambulatory Visit

## 2025-01-31 ENCOUNTER — Ambulatory Visit

## 2025-02-17 ENCOUNTER — Ambulatory Visit: Admitting: Family Medicine

## 2025-03-09 ENCOUNTER — Ambulatory Visit: Admitting: Nurse Practitioner
